# Patient Record
Sex: Female | Born: 1980 | Race: White | Hispanic: No | Marital: Married | Smoking: Never smoker
Health system: Southern US, Community
[De-identification: ages and names within clinical notes are randomized; demographics above are authoritative.]

## PROBLEM LIST (undated history)

## (undated) DIAGNOSIS — R87629 Unspecified abnormal cytological findings in specimens from vagina: Secondary | ICD-10-CM

## (undated) DIAGNOSIS — Z98891 History of uterine scar from previous surgery: Secondary | ICD-10-CM

## (undated) HISTORY — DX: History of uterine scar from previous surgery: Z98.891

## (undated) HISTORY — DX: Unspecified abnormal cytological findings in specimens from vagina: R87.629

---
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
## 2000-04-01 ENCOUNTER — Other Ambulatory Visit: Admission: RE | Admit: 2000-04-01 | Discharge: 2000-04-01 | Payer: Self-pay | Admitting: Obstetrics and Gynecology

## 2002-09-15 ENCOUNTER — Emergency Department (HOSPITAL_COMMUNITY): Admission: EM | Admit: 2002-09-15 | Discharge: 2002-09-16 | Payer: Self-pay | Admitting: Emergency Medicine

## 2002-09-15 ENCOUNTER — Encounter: Payer: Self-pay | Admitting: Emergency Medicine

## 2003-07-13 ENCOUNTER — Ambulatory Visit (HOSPITAL_COMMUNITY): Admission: RE | Admit: 2003-07-13 | Discharge: 2003-07-13 | Payer: Self-pay | Admitting: *Deleted

## 2010-04-15 ENCOUNTER — Encounter: Payer: Self-pay | Admitting: *Deleted

## 2010-09-24 ENCOUNTER — Emergency Department: Payer: Self-pay | Admitting: Emergency Medicine

## 2011-03-26 DIAGNOSIS — R87629 Unspecified abnormal cytological findings in specimens from vagina: Secondary | ICD-10-CM

## 2011-03-26 HISTORY — DX: Unspecified abnormal cytological findings in specimens from vagina: R87.629

## 2014-01-21 ENCOUNTER — Other Ambulatory Visit: Payer: Self-pay | Admitting: Gynecologic Oncology

## 2014-03-25 NOTE — L&D Delivery Note (Signed)
Delivery Summary for Taylor [name]  Labor Events:   Preterm labor:   Rupture date:   Rupture time:   Rupture type:   Fluid Color:   Induction:   Augmentation:   Complications:   Cervical ripening:          Delivery:   Episiotomy:   Lacerations:   Repair suture:   Repair # of packets:   Blood loss (ml): 700   Information for the patient's newborn:  Taylor [name], Taylor [name] [[id number]][[id number]]    Delivery 02/02/2015 12:45 PM by  C-Section, Low Transverse Sex:  female Gestational Age: 2884w2d Delivery Clinician:  Hildred LaserAnika Taron Mondor Living?: Yes        APGARS  One minute Five minutes Ten minutes  Skin color: 0   1      Heart rate: 2   2      Grimace: 2   2      Muscle tone: 1   2      Breathing: 1   2      Totals: 6  9      Presentation/position:      Resuscitation:   Cord information:    Disposition of cord blood:     Blood gases sent?  Complications:   Placenta: Delivered:       appearance Newborn Measurements: Weight: 8 lb 3.6 oz (3730 g)  Height:    Head circumference:    Chest circumference:    Other providers: Registered Nurse Registered Nurse Transition RN Neonatologist Verlin Dikeina M Braimah Kelly A Yates Carolyn E Wanek David C Ehrmann  Additional  information: Forceps:   Vacuum:   Breech:   Observed anomalies         See Cesarean Section Operative Note for details of delivery procedure.    Hildred LaserAnika Rayshon Albaugh, MD [facility]

## 2014-05-02 LAB — HM PAP SMEAR

## 2014-07-22 LAB — OB RESULTS CONSOLE ABO/RH: RH Type: POSITIVE

## 2014-07-22 LAB — OB RESULTS CONSOLE HEPATITIS B SURFACE ANTIGEN: Hepatitis B Surface Ag: NEGATIVE

## 2014-07-22 LAB — OB RESULTS CONSOLE RUBELLA ANTIBODY, IGM: Rubella: NON-IMMUNE/NOT IMMUNE

## 2014-07-22 LAB — OB RESULTS CONSOLE PLATELET COUNT: Platelets: 198 10*3/uL

## 2014-07-22 LAB — OB RESULTS CONSOLE HIV ANTIBODY (ROUTINE TESTING): HIV: NONREACTIVE

## 2014-07-22 LAB — OB RESULTS CONSOLE GC/CHLAMYDIA
Chlamydia: NEGATIVE
Gonorrhea: NEGATIVE

## 2014-07-22 LAB — OB RESULTS CONSOLE HGB/HCT, BLOOD
HCT: 35 %
Hemoglobin: 12.1 g/dL

## 2014-07-22 LAB — OB RESULTS CONSOLE VARICELLA ZOSTER ANTIBODY, IGG: VARICELLA IGG: NON-IMMUNE/NOT IMMUNE

## 2014-07-22 LAB — OB RESULTS CONSOLE RPR: RPR: NONREACTIVE

## 2014-08-15 ENCOUNTER — Ambulatory Visit
Admission: RE | Admit: 2014-08-15 | Discharge: 2014-08-15 | Disposition: A | Discharge status: Home / Self Care | Payer: Medicaid Other | Source: Ambulatory Visit | Attending: Maternal & Fetal Medicine | Admitting: Maternal & Fetal Medicine

## 2014-08-15 VITALS — BP 116/66 | HR 95 | Temp 98.7°F | Ht 60.0 in | Wt 113.0 lb

## 2014-08-15 DIAGNOSIS — Z8279 Family history of other congenital malformations, deformations and chromosomal abnormalities: Secondary | ICD-10-CM

## 2014-08-15 NOTE — Progress Notes (Signed)
Defrancesco, Taylor Taylor [name] Length of Consultation: 50 minutes  Taylor Taylor [name] [facility] for genetic counseling to review prenatal screening and testing options due to Taylor Taylor [name] of anomalies in her first pregnancy.  This note summarizes the information we discussed.    We obtained Taylor [name] detailed family Taylor [name] and pregnancy Taylor [name].  This is the second pregnancy for this patient, the first with her current partner.  Taylor Taylor [name] reported no complications or exposures which are expected to increase the risk for birth defects in the current pregnancy.  The family Taylor [name] is remarkable for their daughter, Taylor Taylor [name], being born with multiple health concerns.  She is now 34 years old with normal cognitive development.  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  There are no other family members with birth defects, developmental differences, recurrent pregnancy loss or known genetic conditions.  Taylor Taylor [name] stated that all testing during her pregnancy with Taylor Taylor [name] and after delivery was normal, and that no specific cause for Taylor Taylor [name]'s condition was identified.  We discussed that her chromosomal microarray results from [facility] were normal, which assesses the chance for small deletions or duplications of genetic material, but that this testing cannot diagnose or rule out all genetic syndromes.  The diagnosis of Klippel-Feil is known to be associated with fused vertebrae, cleft palate, hearing loss, urinary tract anomalies, heart defects and other birth differences.  Therefore, this condition can account for some of Taylor Taylor [name]'s health concerns.  Hydrocephaly is not commonly  associated with this condition.  Most cases of Klippel-Feil occur sporadically, with no other affected family members.  Some cases, however, are known to be due to differences in specific genes (GDF6, GDF3 and MEOX1) that can be inherited as dominant or recessive traits.  If Taylor Taylor [name] desires additional genetic evaluation or DNA testing for Taylor Taylor [name] for any of these genetic mutations, another visit to [facility] could be scheduled.  Their number is [phone].  Given the absence of any other family Taylor [name] of Klippel-Feil or similar health concerns and the fact that this pregnancy is with Taylor [name] different partner, we would expect Taylor [name] low recurrence chance for this pregnancy.  We discussed that we may use level 2 ultrasound after 16 to [redacted] weeks gestation to assess for similar health concerns in this pregnancy.  However, it is important to remember that not all birth defects can be diagnosed prenatally.    As part of routine prenatal care, Taylor Taylor [name] stated that she had normal first trimester screening at [facility].  If there are any concerns with those results, we are happy to dicuss this further.    Cystic Fibrosis screening was also discussed with the patient. Cystic fibrosis (CF) is one of the most common genetic conditions in persons of Caucasian ancestry.  This condition occurs in approximately 1 in 2,500 Caucasian persons and results in thickened secretions in the lungs, digestive, and reproductive systems.  For Taylor [name] baby to be at risk for having CF, both of the parents must be carriers for this condition.  Approximately 1 in 5725 Caucasian persons is Taylor [name] carrier for CF.  Current carrier testing looks for the most common mutations in the gene  for CF and can detect approximately 90% of carriers in the Caucasian population.  This means that the carrier screening can greatly reduce, but cannot eliminate, the chance for an individual to have Taylor [name] child with CF.  If an individual is found to be Taylor [name]  carrier for CF, then carrier testing would be available for the partner. As part of [place]'s newborn screening profile, all babies born in the state of [place] will have Taylor [name] two-tier screening process.  Specimens are first tested to determine the concentration of immunoreactive trypsinogen (IRT).  The top 5% of specimens with the highest IRT values then undergo DNA testing using Taylor [name] panel of over 40 common CF mutations.   After consideration of the options, Taylor Taylor [name] elected to proceed with scheduling an ultrasound in the second trimester for Taylor [name] detailed evaluation of the fetal anatomy and to decline CF carrier screening.  Ms. Warren Lacy was encouraged to call with questions or concerns.  We can be contacted at [phone].   Taylor Anderson, MS, CGC

## 2014-08-25 NOTE — Progress Notes (Signed)
Pt reviewed with Mike GipGC Wells, agree with plans as outlined in consultation note.

## 2014-09-01 ENCOUNTER — Encounter: Payer: Self-pay | Admitting: Obstetrics and Gynecology

## 2014-09-01 ENCOUNTER — Ambulatory Visit (INDEPENDENT_AMBULATORY_CARE_PROVIDER_SITE_OTHER): Payer: Medicaid Other | Admitting: Obstetrics and Gynecology

## 2014-09-01 ENCOUNTER — Ambulatory Visit
Admission: RE | Admit: 2014-09-01 | Discharge: 2014-09-01 | Disposition: A | Discharge status: Home / Self Care | Payer: Medicaid Other | Source: Ambulatory Visit | Attending: Maternal & Fetal Medicine | Admitting: Maternal & Fetal Medicine

## 2014-09-01 VITALS — BP 128/72 | HR 99 | Temp 98.1°F | Resp 18 | Ht 60.0 in | Wt 115.0 lb

## 2014-09-01 VITALS — BP 125/66 | HR 90 | Wt 115.1 lb

## 2014-09-01 DIAGNOSIS — O352XX0 Maternal care for (suspected) hereditary disease in fetus, not applicable or unspecified: Secondary | ICD-10-CM | POA: Insufficient documentation

## 2014-09-01 DIAGNOSIS — Z3402 Encounter for supervision of normal first pregnancy, second trimester: Secondary | ICD-10-CM

## 2014-09-01 DIAGNOSIS — Z3A17 17 weeks gestation of pregnancy: Secondary | ICD-10-CM | POA: Insufficient documentation

## 2014-09-01 DIAGNOSIS — Z36 Encounter for antenatal screening of mother: Secondary | ICD-10-CM | POA: Insufficient documentation

## 2014-09-01 LAB — POCT URINALYSIS DIPSTICK
Bilirubin, UA: NEGATIVE
Blood, UA: NEGATIVE
Glucose, UA: NEGATIVE
Ketones, UA: NEGATIVE
NITRITE UA: NEGATIVE
Protein, UA: NEGATIVE
SPEC GRAV UA: 1.02
Urobilinogen, UA: 0.2
pH, UA: 6.5

## 2014-09-01 NOTE — Patient Instructions (Addendum)
Follow up in 4 weeks.  Given hot weather precautions.

## 2014-09-01 NOTE — Progress Notes (Signed)
Patient doing well. No complaints.  Notes that she will be traveling to [facility] this weekend.  Advised on hot weather precautions. Is s/p consultation with [facility] for current pregnancy due to multiple fetal anomalies in prior pregnancy (although no genetic cause found). Reports having detailed anatomy scan earlier this week at [facility] with normal findings.  1st trimester screening with NT was negative. Reviewed note from [facility]. Awaiting ultrasuond report. Can continue with routine PNC follow up.  RTC in 4 weeks.

## 2014-09-29 ENCOUNTER — Other Ambulatory Visit: Payer: Self-pay | Admitting: Obstetrics and Gynecology

## 2014-09-29 ENCOUNTER — Ambulatory Visit (INDEPENDENT_AMBULATORY_CARE_PROVIDER_SITE_OTHER): Payer: Medicaid Other | Admitting: Obstetrics and Gynecology

## 2014-09-29 ENCOUNTER — Encounter: Payer: Self-pay | Admitting: Obstetrics and Gynecology

## 2014-09-29 VITALS — BP 100/63 | HR 92 | Wt 122.5 lb

## 2014-09-29 DIAGNOSIS — Z3492 Encounter for supervision of normal pregnancy, unspecified, second trimester: Secondary | ICD-10-CM

## 2014-09-29 DIAGNOSIS — Z1379 Encounter for other screening for genetic and chromosomal anomalies: Secondary | ICD-10-CM

## 2014-09-29 DIAGNOSIS — O352XX1 Maternal care for (suspected) hereditary disease in fetus, fetus 1: Secondary | ICD-10-CM

## 2014-09-29 LAB — POCT URINALYSIS DIPSTICK
Bilirubin, UA: NEGATIVE
Blood, UA: NEGATIVE
Glucose, UA: NEGATIVE
Ketones, UA: NEGATIVE
LEUKOCYTES UA: NEGATIVE
Nitrite, UA: NEGATIVE
Protein, UA: NEGATIVE
Spec Grav, UA: <=1.005
Urobilinogen, UA: 0.2
pH, UA: 7

## 2014-09-29 NOTE — Progress Notes (Signed)
No complaints. Saw [facility] 4 weeks ago. Will see them again 11/17/2014.

## 2014-09-29 NOTE — Progress Notes (Signed)
ROB: Doing well without complaints.  For serum AFP today.  Will f/u with [facility] (still not received).  Is scheduled to return to them for f/u at 28 weeks. RTC in 4 weeks.

## 2014-10-01 LAB — AFP, SERUM, OPEN SPINA BIFIDA
AFP MoM: 0.71
AFP Value: 53.9 ng/mL
Gest. Age on Collection Date: 21.3 weeks
MATERNAL AGE AT EDD: 34.2 a
OSBR RISK 1 IN: 10000
Test Results:: NEGATIVE
WEIGHT: 122 [lb_av]

## 2014-10-27 ENCOUNTER — Ambulatory Visit (INDEPENDENT_AMBULATORY_CARE_PROVIDER_SITE_OTHER): Payer: Medicaid Other | Admitting: Obstetrics and Gynecology

## 2014-10-27 VITALS — BP 120/74 | HR 88 | Wt 128.0 lb

## 2014-10-27 DIAGNOSIS — Z1389 Encounter for screening for other disorder: Secondary | ICD-10-CM

## 2014-10-27 DIAGNOSIS — Z3492 Encounter for supervision of normal pregnancy, unspecified, second trimester: Secondary | ICD-10-CM

## 2014-10-27 LAB — POCT URINALYSIS DIPSTICK
BILIRUBIN UA: NEGATIVE
GLUCOSE UA: NEGATIVE
KETONES UA: NEGATIVE
Leukocytes, UA: NEGATIVE
Nitrite, UA: NEGATIVE
Protein, UA: NEGATIVE
RBC UA: NEGATIVE
SPEC GRAV UA: 1.015
Urobilinogen, UA: NEGATIVE
pH, UA: 7

## 2014-10-27 NOTE — Progress Notes (Signed)
ROB: Doing well.  Denies complaints.  Serum AFP neg. RTC in 3 weeks. For 28 week labs, Tdap, and sign blood consents at that time.

## 2014-11-17 ENCOUNTER — Ambulatory Visit
Admission: RE | Admit: 2014-11-17 | Discharge: 2014-11-17 | Disposition: A | Discharge status: Home / Self Care | Payer: Medicaid Other | Source: Ambulatory Visit | Attending: Obstetrics and Gynecology | Admitting: Obstetrics and Gynecology

## 2014-11-17 ENCOUNTER — Other Ambulatory Visit: Payer: Self-pay

## 2014-11-17 ENCOUNTER — Ambulatory Visit (INDEPENDENT_AMBULATORY_CARE_PROVIDER_SITE_OTHER): Payer: Medicaid Other | Admitting: Obstetrics and Gynecology

## 2014-11-17 VITALS — BP 99/67 | HR 101 | Wt 130.6 lb

## 2014-11-17 DIAGNOSIS — O352XX0 Maternal care for (suspected) hereditary disease in fetus, not applicable or unspecified: Secondary | ICD-10-CM | POA: Diagnosis not present

## 2014-11-17 DIAGNOSIS — R7309 Other abnormal glucose: Secondary | ICD-10-CM

## 2014-11-17 DIAGNOSIS — O09299 Supervision of pregnancy with other poor reproductive or obstetric history, unspecified trimester: Secondary | ICD-10-CM

## 2014-11-17 DIAGNOSIS — Z3A28 28 weeks gestation of pregnancy: Secondary | ICD-10-CM | POA: Diagnosis not present

## 2014-11-17 DIAGNOSIS — Z3492 Encounter for supervision of normal pregnancy, unspecified, second trimester: Secondary | ICD-10-CM

## 2014-11-17 DIAGNOSIS — Z23 Encounter for immunization: Secondary | ICD-10-CM

## 2014-11-17 DIAGNOSIS — O352XX1 Maternal care for (suspected) hereditary disease in fetus, fetus 1: Secondary | ICD-10-CM

## 2014-11-17 NOTE — Progress Notes (Signed)
ROB: Doing well. Denies complaints. Had anatomy scan with [facility], normal except mildly dilated unilateral renal pelvis.  Recommend f/u in 5 weeks, scheduled.  Glucola performed, Tdap done, blood consent signed today.  RTC in 3 weeks.

## 2014-11-18 ENCOUNTER — Telehealth: Payer: Self-pay

## 2014-11-18 DIAGNOSIS — R7309 Other abnormal glucose: Secondary | ICD-10-CM

## 2014-11-18 LAB — GLUCOSE TOLERANCE, 1 HOUR: Glucose, 1Hr PP: 152 mg/dL (ref 65–199)

## 2014-11-18 LAB — HEMOGLOBIN AND HEMATOCRIT, BLOOD
HEMOGLOBIN: 9.7 g/dL — AB (ref 11.1–15.9)
Hematocrit: 28.4 % — ABNORMAL LOW (ref 34.0–46.6)

## 2014-11-18 NOTE — Telephone Encounter (Signed)
-----   Message from Hildred Laser, MD sent at 11/18/2014 12:40 PM EDT ----- Please inform patient that her glucola was elevated (152), needs to perform 3 hr testing.  Also, has mild anemia of pregnancy, needs to begin taking a daily iron supplement in addition to her PNV.

## 2014-11-18 NOTE — Telephone Encounter (Signed)
Pt notified of elevated glucola and need for 3 hr testing and of need to take iron in addition to PNV.

## 2014-11-18 NOTE — Telephone Encounter (Deleted)
-----   Message from Taylor Cherry, MD sent at 11/18/2014 12:40 PM EDT ----- Please inform patient that her glucola was elevated (152), needs to perform 3 hr testing.  Also, has mild anemia of pregnancy, needs to begin taking a daily iron supplement in addition to her PNV. 

## 2014-11-23 ENCOUNTER — Other Ambulatory Visit: Payer: Medicaid Other

## 2014-11-23 DIAGNOSIS — O99013 Anemia complicating pregnancy, third trimester: Secondary | ICD-10-CM | POA: Insufficient documentation

## 2014-11-24 LAB — GESTATIONAL GLUCOSE TOLERANCE
GLUCOSE 1 HOUR GTT: 162 mg/dL (ref 65–179)
GLUCOSE 2 HOUR GTT: 153 mg/dL (ref 65–154)
Glucose, Fasting: 78 mg/dL (ref 65–94)
Glucose, GTT - 3 Hour: 150 mg/dL — ABNORMAL HIGH (ref 65–139)

## 2014-11-29 ENCOUNTER — Ambulatory Visit (INDEPENDENT_AMBULATORY_CARE_PROVIDER_SITE_OTHER): Payer: Medicaid Other | Admitting: Obstetrics and Gynecology

## 2014-11-29 ENCOUNTER — Encounter: Payer: Self-pay | Admitting: Obstetrics and Gynecology

## 2014-11-29 VITALS — BP 98/59 | HR 97 | Wt 131.8 lb

## 2014-11-29 DIAGNOSIS — Z23 Encounter for immunization: Secondary | ICD-10-CM | POA: Diagnosis not present

## 2014-11-29 DIAGNOSIS — Z3403 Encounter for supervision of normal first pregnancy, third trimester: Secondary | ICD-10-CM

## 2014-11-29 LAB — POCT URINALYSIS DIPSTICK
Bilirubin, UA: NEGATIVE
Glucose, UA: NEGATIVE
Ketones, UA: NEGATIVE
NITRITE UA: NEGATIVE
PH UA: 7
Protein, UA: NEGATIVE
RBC UA: NEGATIVE
Spec Grav, UA: <=1.005
UROBILINOGEN UA: NEGATIVE

## 2014-11-29 NOTE — Progress Notes (Signed)
ROB: Patient doing well, no complaints.  For flu vaccine today.  Desires OCPs for contraception.  Discussed TOLAC vs repeat C-section.  Patient desires C-section. To be scheduled for repeat C-section on 02/02/2015.  1 hr glucola abnormal, 3 hr normal.  RTC in 2 weeks.

## 2014-12-13 ENCOUNTER — Ambulatory Visit (INDEPENDENT_AMBULATORY_CARE_PROVIDER_SITE_OTHER): Payer: Medicaid Other | Admitting: Obstetrics and Gynecology

## 2014-12-13 VITALS — BP 107/72 | HR 98 | Wt 132.4 lb

## 2014-12-13 DIAGNOSIS — Z3493 Encounter for supervision of normal pregnancy, unspecified, third trimester: Secondary | ICD-10-CM

## 2014-12-13 DIAGNOSIS — O352XX1 Maternal care for (suspected) hereditary disease in fetus, fetus 1: Secondary | ICD-10-CM

## 2014-12-13 LAB — POCT URINALYSIS DIPSTICK
Bilirubin, UA: NEGATIVE
Glucose, UA: NEGATIVE
KETONES UA: NEGATIVE
Nitrite, UA: NEGATIVE
PH UA: 6.5
PROTEIN UA: NEGATIVE
RBC UA: NEGATIVE
SPEC GRAV UA: 1.015
Urobilinogen, UA: NEGATIVE

## 2014-12-13 NOTE — Progress Notes (Signed)
ROB: Denies complaints except occasional Deberah Pelton. PTL precautions reviewed.  RTC in 2 weeks.

## 2014-12-13 NOTE — Progress Notes (Signed)
Braxton hicks

## 2014-12-22 ENCOUNTER — Ambulatory Visit
Admission: RE | Admit: 2014-12-22 | Discharge: 2014-12-22 | Disposition: A | Discharge status: Home / Self Care | Payer: Medicaid Other | Source: Ambulatory Visit | Attending: Obstetrics and Gynecology | Admitting: Obstetrics and Gynecology

## 2014-12-27 ENCOUNTER — Ambulatory Visit (INDEPENDENT_AMBULATORY_CARE_PROVIDER_SITE_OTHER): Payer: Medicaid Other | Admitting: Obstetrics and Gynecology

## 2014-12-27 VITALS — BP 108/68 | HR 102 | Temp 98.9°F | Wt 135.5 lb

## 2014-12-27 DIAGNOSIS — O289 Unspecified abnormal findings on antenatal screening of mother: Secondary | ICD-10-CM | POA: Insufficient documentation

## 2014-12-27 DIAGNOSIS — O409XX0 Polyhydramnios, unspecified trimester, not applicable or unspecified: Secondary | ICD-10-CM

## 2014-12-27 DIAGNOSIS — Z3493 Encounter for supervision of normal pregnancy, unspecified, third trimester: Secondary | ICD-10-CM

## 2014-12-27 LAB — POCT URINALYSIS DIP (MANUAL ENTRY)
Bilirubin, UA: NEGATIVE
Blood, UA: NEGATIVE
Glucose, UA: NEGATIVE
Ketones, POC UA: NEGATIVE
LEUKOCYTES UA: NEGATIVE
NITRITE UA: NEGATIVE
PH UA: 7.5
PROTEIN UA: NEGATIVE
Spec Grav, UA: <=1.005
Urobilinogen, UA: 0.2

## 2014-12-27 NOTE — Progress Notes (Signed)
ROB: Patient doing well, however does note becoming more fatigued.  S/p ultrasound with [facility], notes that she was told her fluid level was increased (upper limitis of normal).  Notes fetus weights ~ 5 1/2 lbs. RTC in 2 weeks. For 36 week labs. Will recheck AFI at that time.

## 2015-01-10 ENCOUNTER — Ambulatory Visit (INDEPENDENT_AMBULATORY_CARE_PROVIDER_SITE_OTHER): Payer: Medicaid Other | Admitting: Obstetrics and Gynecology

## 2015-01-10 ENCOUNTER — Ambulatory Visit: Payer: Medicaid Other

## 2015-01-10 VITALS — BP 131/75 | HR 120 | Wt 138.3 lb

## 2015-01-10 DIAGNOSIS — O352XX0 Maternal care for (suspected) hereditary disease in fetus, not applicable or unspecified: Secondary | ICD-10-CM

## 2015-01-10 DIAGNOSIS — O409XX0 Polyhydramnios, unspecified trimester, not applicable or unspecified: Secondary | ICD-10-CM

## 2015-01-10 DIAGNOSIS — O289 Unspecified abnormal findings on antenatal screening of mother: Secondary | ICD-10-CM

## 2015-01-10 DIAGNOSIS — Z3493 Encounter for supervision of normal pregnancy, unspecified, third trimester: Secondary | ICD-10-CM

## 2015-01-10 DIAGNOSIS — O352XX1 Maternal care for (suspected) hereditary disease in fetus, fetus 1: Secondary | ICD-10-CM

## 2015-01-10 LAB — POCT URINALYSIS DIP (MANUAL ENTRY)
BILIRUBIN UA: NEGATIVE
BILIRUBIN UA: NEGATIVE
Glucose, UA: NEGATIVE
Leukocytes, UA: NEGATIVE
NITRITE UA: NEGATIVE
PH UA: 6
Protein Ur, POC: NEGATIVE
RBC UA: NEGATIVE
Spec Grav, UA: <=1.005
Urobilinogen, UA: 0.2

## 2015-01-10 NOTE — Progress Notes (Signed)
ROB: Patient doing well.  Denies complaints.  Notes infrequent contractions.  PTL precautions given.  S/p f/u AFI for recent diagnosis of polyhydramnios.  AFI 23 cm today.  For weekly NSTs until delivery. Reiterated fetal kick counts.  Scheduled for repeat C-section in 3 weeks.   FETAL SURVEILLANCE TESTING SUMMARY  INDICATIONS: Polyhydramnios  OBJECTIVE RESULTS: Fetal heart variability: moderate Fetal Heart Rate decelerations: none Fetal Heart Rate accelerations: yes Baseline FHR: 145 per minute Fetal Non-stress Test: reactive Amniotic Fluid Index: 23 cm Uterine irritability: yes  ASSESSMENT: IUP at 36.0 weeks  Fetal surveillance: reassuring   PLAN:  1. Fetal kick counts twice daily 2. NST weekly 3. Scheduled for repeat C-section at 39 weeks.

## 2015-01-12 LAB — GC/CHLAMYDIA PROBE AMP
CHLAMYDIA, DNA PROBE: NEGATIVE
NEISSERIA GONORRHOEAE BY PCR: NEGATIVE

## 2015-01-15 LAB — CULTURE, BETA STREP (GROUP B ONLY): STREP GP B CULTURE: NEGATIVE

## 2015-01-17 ENCOUNTER — Ambulatory Visit (INDEPENDENT_AMBULATORY_CARE_PROVIDER_SITE_OTHER): Payer: Medicaid Other | Admitting: Obstetrics and Gynecology

## 2015-01-17 VITALS — BP 111/69 | HR 99 | Wt 137.2 lb

## 2015-01-17 DIAGNOSIS — O289 Unspecified abnormal findings on antenatal screening of mother: Secondary | ICD-10-CM

## 2015-01-17 DIAGNOSIS — O409XX0 Polyhydramnios, unspecified trimester, not applicable or unspecified: Secondary | ICD-10-CM

## 2015-01-17 DIAGNOSIS — Z3493 Encounter for supervision of normal pregnancy, unspecified, third trimester: Secondary | ICD-10-CM | POA: Diagnosis not present

## 2015-01-17 DIAGNOSIS — Z8279 Family history of other congenital malformations, deformations and chromosomal abnormalities: Secondary | ICD-10-CM

## 2015-01-17 NOTE — Progress Notes (Signed)
ROB: Patient doing well.  Denies complaints.  Labor precautions given.  For weekly NSTs until delivery for polyhydramnios. Reiterated fetal kick counts.  For pre-op next week for C-section at 39 weeks.  RTC in 1 week.  FETAL SURVEILLANCE TESTING SUMMARY  INDICATIONS: Polyhydramnios  OBJECTIVE RESULTS: Fetal heart variability: moderate Fetal Heart Rate decelerations: none Fetal Heart Rate accelerations: yes Baseline FHR: 140 per minute Fetal Non-stress Test: reactive Uterine contractions: yes, irregular  ASSESSMENT: IUP at 36.0 weeks  Fetal surveillance: reassuring   PLAN:  1. Fetal kick counts twice daily 2. NST weekly 3. Scheduled for repeat C-section at 39 weeks.

## 2015-01-26 ENCOUNTER — Ambulatory Visit (INDEPENDENT_AMBULATORY_CARE_PROVIDER_SITE_OTHER): Payer: Medicaid Other | Admitting: Obstetrics and Gynecology

## 2015-01-26 VITALS — BP 115/72 | HR 96 | Wt 137.9 lb

## 2015-01-26 DIAGNOSIS — O409XX0 Polyhydramnios, unspecified trimester, not applicable or unspecified: Secondary | ICD-10-CM

## 2015-01-26 DIAGNOSIS — Z3493 Encounter for supervision of normal pregnancy, unspecified, third trimester: Secondary | ICD-10-CM

## 2015-01-26 DIAGNOSIS — O289 Unspecified abnormal findings on antenatal screening of mother: Secondary | ICD-10-CM

## 2015-01-26 DIAGNOSIS — O99013 Anemia complicating pregnancy, third trimester: Secondary | ICD-10-CM

## 2015-01-26 LAB — POCT URINALYSIS DIPSTICK
BILIRUBIN UA: NEGATIVE
GLUCOSE UA: NEGATIVE
Ketones, UA: NEGATIVE
Leukocytes, UA: NEGATIVE
Nitrite, UA: NEGATIVE
Protein, UA: NEGATIVE
RBC UA: NEGATIVE
SPEC GRAV UA: 1.01
UROBILINOGEN UA: NEGATIVE
pH, UA: 7

## 2015-01-26 NOTE — Progress Notes (Signed)
NONSTRESS TEST INTERPRETATION  INDICATIONS: Polyhydramnios, contractions FHR baseline: 145 RESULTS: reactive COMMENTS: contractions every 6-8 min.   PLAN: 1. Continue fetal kick counts twice a day. 2. Scheduled for C/S on 02/02/2015  Fenton Mallingebbie Sharaine Delange, LPN

## 2015-01-26 NOTE — Progress Notes (Signed)
ROB: Patient reports notes occasional ctx, mildly painful.  Pre-op performed today for scheduled C-section on 02/02/15. NST today performed, reactive, with contractions q 6-8 min. Cervix unchanged.  Labor precautions given.

## 2015-01-30 NOTE — H&P (Signed)
Obstetric Preoperative History and Physical  Taylor [name] is a 34 y.o. G2P1001 with IUP at 4659w2d presenting for pre-operative exam for scheduled repeat cesarean section on 02/02/2015.  No acute concerns.   Prenatal Course Source of Care: [facility] with onset of care at 11 weeks Pregnancy complications or risks: Patient Active Problem List   Diagnosis Date Noted  . AFI (amniotic fluid index) increased 12/27/2014  . Anemia of pregnancy in third trimester 11/23/2014  . Previous child with congenital anomaly, currently pregnant, antepartum 09/01/2014  . Family history of congenital anomalies 08/15/2014   She plans to breastfeed She desires oral contraceptives (estrogen/progesterone) for postpartum contraception.   Prenatal labs and studies: ABO, Rh: O/Positive/-- (04/29 0000) Antibody:  Negative (04/29 0000) Rubella: Nonimmune (04/29 0000) RPR: Nonreactive (04/29 0000)  HBsAg: Negative (04/29 0000)  HIV: Non-reactive (04/29 0000)  GBS: Negative (10/18  0258) 1 hr Glucola elevated (152), 3 hr GTT with 1 abnormal value Genetic screening normal Anatomy US normal  Prenatal Transfer Tool  Maternal Diabetes: No Genetic Screening: Normal Maternal Ultrasounds/Referrals: Normal except for -  Abnormal:  Findings:   Other: Increased amniotic fluid Fetal Ultrasounds or other Referrals:  Referred to Materal Fetal Medicine  for history of previous child with congenital anomaly Maternal Substance Abuse:  No Significant Maternal Medications:  None Significant Maternal Lab Results: None  Past Medical History  Diagnosis Date  . Vaginal Pap smear, abnormal 2013    colpo neg last3 normal  . Need for Tdap vaccination     at 28 weeks  . History of cesarean section     Past Surgical History  Procedure Laterality Date  . Cesarean section  2005    OB History  Gravida Para Term Preterm AB SAB TAB Ectopic Multiple Living  2 1 1       1     # Outcome Date GA Lbr Len/2nd  Weight Sex Delivery Anes PTL Lv  2 Current           1 Term 2005   6 lb 8 oz (2.948 kg) F CS-Unspec   Y      Social History   Social History  . Marital Status: Single    Spouse Name: N/A  . Number of Children: N/A  . Years of Education: N/A   Social History Main Topics  . Smoking status: Never Smoker   . Smokeless tobacco: Never Used  . Alcohol Use: No  . Drug Use: No  . Sexual Activity: Yes    Birth Control/ Protection: None     Comment: Preganant   Other Topics Concern  . Not on file   Social History Narrative    Family History  Problem Relation Age of Onset  . Diabetes Neg Hx   . Heart disease Neg Hx   . Breast cancer Neg Hx   . Ovarian cancer Neg Hx   . Hyperlipidemia Mother      (Not in a hospital admission)  No Known Allergies  Review of Systems: Negative except for what is mentioned in HPI.  Physical Exam: BP 115/72 mmHg  Pulse 96  Wt 137 lb 14.4 oz (62.551 kg)  LMP 05/03/2014 FHR by Doppler: 153 bpm GENERAL: Well-developed, well-nourished female in no acute distress.  LUNGS: Clear to auscultation bilaterally.  HEART: Regular rate and rhythm. ABDOMEN: Soft, nontender, nondistended, gravid, well-healed Pfannenstiel incision. PELVIC: Deferred EXTREMITIES: Nontender, no edema, 2+ distal pulses.   Pertinent Labs/Studies:   Lab Results  Component Value Date  HGB 9.7* 11/17/2014   HCT 28.4* 11/17/2014   PLT 198 07/22/2014   Assessment and Plan :Taylor OBRIEN is a 34 y.o. G2P1001 at [redacted]w[redacted]d being scheduled for repeat scheduled cesarean section delivery on 02/02/2015 . The patient is understanding of the planned procedure and is aware of and accepting of all surgical risks, including but not limited to: bleeding which may require transfusion or reoperation; infection which may require antibiotics; injury to bowel, bladder, ureters or other surrounding organs which may require repair; injury to the fetus; need for additional procedures including  hysterectomy in the event of life-threatening complications; placental abnormalities wth subsequent pregnancies; incisional problems; blood clot disorders which may require blood thinners;, and other postoperative/anesthesia complications. The patient is in agreement with the proposed plan, and gives informed written consent for the procedure. All questions have been answered.    Hildred Laser, MD [facility]

## 2015-02-01 ENCOUNTER — Encounter
Admission: RE | Admit: 2015-02-01 | Discharge: 2015-02-01 | Disposition: A | Discharge status: Home / Self Care | Payer: Medicaid Other | Source: Ambulatory Visit | Attending: Obstetrics and Gynecology | Admitting: Obstetrics and Gynecology

## 2015-02-01 LAB — CBC
HEMATOCRIT: 29.7 % — AB (ref 35.0–47.0)
Hemoglobin: 9.9 g/dL — ABNORMAL LOW (ref 12.0–16.0)
MCH: 29.6 pg (ref 26.0–34.0)
MCHC: 33.3 g/dL (ref 32.0–36.0)
MCV: 88.9 fL (ref 80.0–100.0)
PLATELETS: 134 10*3/uL — AB (ref 150–440)
RBC: 3.35 MIL/uL — ABNORMAL LOW (ref 3.80–5.20)
RDW: 14.4 % (ref 11.5–14.5)
WBC: 8.9 10*3/uL (ref 3.6–11.0)

## 2015-02-01 LAB — RAPID HIV SCREEN (HIV 1/2 AB+AG)
HIV 1/2 Antibodies: NONREACTIVE
HIV-1 P24 Antigen - HIV24: NONREACTIVE

## 2015-02-01 LAB — TYPE AND SCREEN
ABO/RH(D): O POS
ANTIBODY SCREEN: NEGATIVE
EXTEND SAMPLE REASON: UNDETERMINED

## 2015-02-01 LAB — ABO/RH: ABO/RH(D): O POS

## 2015-02-01 NOTE — Pre-Procedure Instructions (Deleted)
Called Dr. Oretha Milchherry's office for preop orders at 0817am

## 2015-02-01 NOTE — Pre-Procedure Instructions (Signed)
Pt instructed not to eat or drink after midnight tonight in preparation for surgery tomorrow.

## 2015-02-02 ENCOUNTER — Inpatient Hospital Stay: Payer: Medicaid Other | Admitting: Anesthesiology

## 2015-02-02 ENCOUNTER — Inpatient Hospital Stay
Admission: RE | Admit: 2015-02-02 | Discharge: 2015-02-04 | DRG: 766 | Disposition: A | Discharge status: Home / Self Care | Payer: Medicaid Other | Source: Ambulatory Visit | Attending: Obstetrics and Gynecology | Admitting: Obstetrics and Gynecology

## 2015-02-02 ENCOUNTER — Encounter: Payer: Self-pay | Admitting: Anesthesiology

## 2015-02-02 ENCOUNTER — Encounter
Admission: RE | Disposition: A | Discharge status: Home / Self Care | Payer: Self-pay | Source: Ambulatory Visit | Attending: Obstetrics and Gynecology

## 2015-02-02 ENCOUNTER — Emergency Department
Admission: EM | Admit: 2015-02-02 | Discharge: 2015-02-02 | Disposition: A | Discharge status: Other Acute Inpatient Hospital | Payer: Medicaid Other

## 2015-02-02 DIAGNOSIS — O409XX0 Polyhydramnios, unspecified trimester, not applicable or unspecified: Secondary | ICD-10-CM | POA: Diagnosis present

## 2015-02-02 DIAGNOSIS — O9081 Anemia of the puerperium: Secondary | ICD-10-CM | POA: Diagnosis not present

## 2015-02-02 DIAGNOSIS — O34219 Maternal care for unspecified type scar from previous cesarean delivery: Secondary | ICD-10-CM | POA: Diagnosis present

## 2015-02-02 DIAGNOSIS — O34211 Maternal care for low transverse scar from previous cesarean delivery: Principal | ICD-10-CM | POA: Diagnosis present

## 2015-02-02 DIAGNOSIS — Z3A39 39 weeks gestation of pregnancy: Secondary | ICD-10-CM | POA: Diagnosis not present

## 2015-02-02 DIAGNOSIS — O99013 Anemia complicating pregnancy, third trimester: Secondary | ICD-10-CM | POA: Diagnosis present

## 2015-02-02 DIAGNOSIS — D649 Anemia, unspecified: Secondary | ICD-10-CM | POA: Diagnosis not present

## 2015-02-02 DIAGNOSIS — O352XX0 Maternal care for (suspected) hereditary disease in fetus, not applicable or unspecified: Secondary | ICD-10-CM | POA: Diagnosis present

## 2015-02-02 DIAGNOSIS — Z3483 Encounter for supervision of other normal pregnancy, third trimester: Secondary | ICD-10-CM

## 2015-02-02 DIAGNOSIS — Z98891 History of uterine scar from previous surgery: Secondary | ICD-10-CM

## 2015-02-02 DIAGNOSIS — O289 Unspecified abnormal findings on antenatal screening of mother: Secondary | ICD-10-CM | POA: Diagnosis present

## 2015-02-02 LAB — RPR: RPR Ser Ql: NONREACTIVE

## 2015-02-02 SURGERY — Surgical Case
Anesthesia: Choice | Wound class: Clean Contaminated

## 2015-02-02 MED ORDER — MAGNESIUM HYDROXIDE 400 MG/5ML PO SUSP: 30.0000 mL | ORAL | Status: DC | PRN

## 2015-02-02 MED ORDER — ONDANSETRON HCL 4 MG/2ML IJ SOLN
4.0000 mg | Freq: Once | INTRAMUSCULAR | Status: AC | PRN
  Administered 2015-02-02: 4 mg via INTRAVENOUS
  Filled 2015-02-02: qty 2

## 2015-02-02 MED ORDER — LACTATED RINGERS IV SOLN
INTRAVENOUS | Status: DC
  Administered 2015-02-03: 125 mL/h via INTRAVENOUS
  Administered 2015-02-03: 11:00:00 via INTRAVENOUS

## 2015-02-02 MED ORDER — SIMETHICONE 80 MG PO CHEW
80.0000 mg | CHEWABLE_TABLET | ORAL | Status: DC
  Administered 2015-02-02 – 2015-02-04 (×2): 80 mg via ORAL
  Filled 2015-02-02: qty 1

## 2015-02-02 MED ORDER — OXYTOCIN 10 UNIT/ML IJ SOLN
40.0000 [IU] | INTRAVENOUS | Status: DC | PRN
  Administered 2015-02-02: 40 [IU] via INTRAVENOUS

## 2015-02-02 MED ORDER — CEFAZOLIN SODIUM 1-5 GM-% IV SOLN
1.0000 g | INTRAVENOUS | Status: AC
  Administered 2015-02-02 (×2): 1 g via INTRAVENOUS
  Filled 2015-02-02: qty 50

## 2015-02-02 MED ORDER — OXYTOCIN 40 UNITS IN LACTATED RINGERS INFUSION - SIMPLE MED: 62.5000 mL/h | INTRAVENOUS | Status: DC

## 2015-02-02 MED ORDER — OXYCODONE-ACETAMINOPHEN 5-325 MG PO TABS
2.0000 | ORAL_TABLET | ORAL | Status: DC | PRN
  Administered 2015-02-03: 2 via ORAL
  Administered 2015-02-04: 1 via ORAL
  Filled 2015-02-02 (×3): qty 2

## 2015-02-02 MED ORDER — DIBUCAINE 1 % RE OINT: 1.0000 "application " | TOPICAL_OINTMENT | RECTAL | Status: DC | PRN

## 2015-02-02 MED ORDER — LIDOCAINE 5 % EX PTCH
1.0000 | MEDICATED_PATCH | CUTANEOUS | Status: DC
  Administered 2015-02-03 – 2015-02-04 (×2): 1 via TRANSDERMAL
  Filled 2015-02-02 (×3): qty 1

## 2015-02-02 MED ORDER — MENTHOL 3 MG MT LOZG: 1.0000 | LOZENGE | OROMUCOSAL | Status: DC | PRN

## 2015-02-02 MED ORDER — BUPIVACAINE IN DEXTROSE 0.75-8.25 % IT SOLN
INTRATHECAL | Status: DC | PRN
  Administered 2015-02-02: 1.4 mL via INTRATHECAL

## 2015-02-02 MED ORDER — LANOLIN HYDROUS EX OINT: 1.0000 "application " | TOPICAL_OINTMENT | CUTANEOUS | Status: DC | PRN

## 2015-02-02 MED ORDER — ONDANSETRON HCL 4 MG/2ML IJ SOLN
INTRAMUSCULAR | Status: DC | PRN
  Administered 2015-02-02: 4 mg via INTRAVENOUS

## 2015-02-02 MED ORDER — LACTATED RINGERS IV SOLN
INTRAVENOUS | Status: DC
  Administered 2015-02-02 (×2): via INTRAVENOUS

## 2015-02-02 MED ORDER — LIDOCAINE 5 % EX PTCH
MEDICATED_PATCH | CUTANEOUS | Status: DC | PRN
  Administered 2015-02-02: 1 via TRANSDERMAL

## 2015-02-02 MED ORDER — WITCH HAZEL-GLYCERIN EX PADS: 1.0000 "application " | MEDICATED_PAD | CUTANEOUS | Status: DC | PRN

## 2015-02-02 MED ORDER — ONDANSETRON HCL 4 MG/2ML IJ SOLN: 4.0000 mg | Freq: Three times a day (TID) | INTRAMUSCULAR | Status: DC | PRN

## 2015-02-02 MED ORDER — SIMETHICONE 80 MG PO CHEW
80.0000 mg | CHEWABLE_TABLET | ORAL | Status: DC | PRN
  Administered 2015-02-03: 80 mg via ORAL
  Filled 2015-02-02 (×2): qty 1

## 2015-02-02 MED ORDER — LACTATED RINGERS IV SOLN
INTRAVENOUS | Status: DC
  Administered 2015-02-02: 11:00:00 via INTRAVENOUS

## 2015-02-02 MED ORDER — OXYCODONE-ACETAMINOPHEN 5-325 MG PO TABS
1.0000 | ORAL_TABLET | ORAL | Status: DC | PRN
  Administered 2015-02-02 – 2015-02-04 (×8): 1 via ORAL
  Filled 2015-02-02 (×6): qty 1

## 2015-02-02 MED ORDER — MORPHINE SULFATE (PF) 0.5 MG/ML IJ SOLN
INTRAMUSCULAR | Status: DC | PRN
  Administered 2015-02-02: .1 mg via INTRATHECAL

## 2015-02-02 MED ORDER — FENTANYL CITRATE (PF) 100 MCG/2ML IJ SOLN: 25.0000 ug | INTRAMUSCULAR | Status: DC | PRN

## 2015-02-02 MED ORDER — FERROUS SULFATE 325 (65 FE) MG PO TABS
325.0000 mg | ORAL_TABLET | Freq: Two times a day (BID) | ORAL | Status: DC
  Administered 2015-02-02 – 2015-02-04 (×4): 325 mg via ORAL
  Filled 2015-02-02 (×4): qty 1

## 2015-02-02 MED ORDER — SENNOSIDES-DOCUSATE SODIUM 8.6-50 MG PO TABS
2.0000 | ORAL_TABLET | ORAL | Status: DC
  Administered 2015-02-02: 2 via ORAL
  Filled 2015-02-02: qty 2

## 2015-02-02 MED ORDER — CITRIC ACID-SODIUM CITRATE 334-500 MG/5ML PO SOLN
30.0000 mL | ORAL | Status: AC
Start: 2015-02-02 — End: 2015-02-02
  Administered 2015-02-02: 30 mL via ORAL
  Filled 2015-02-02: qty 15

## 2015-02-02 MED ORDER — FENTANYL CITRATE (PF) 100 MCG/2ML IJ SOLN
INTRAMUSCULAR | Status: DC | PRN
Start: 2015-02-02 — End: 2015-02-02
  Administered 2015-02-02: 15 ug via INTRATHECAL

## 2015-02-02 MED ORDER — ACETAMINOPHEN 325 MG PO TABS: 650.0000 mg | ORAL_TABLET | ORAL | Status: DC | PRN

## 2015-02-02 MED ORDER — DIPHENHYDRAMINE HCL 25 MG PO CAPS
25.0000 mg | ORAL_CAPSULE | Freq: Four times a day (QID) | ORAL | Status: DC | PRN
  Filled 2015-02-02: qty 1

## 2015-02-02 MED ORDER — PHENYLEPHRINE HCL 10 MG/ML IJ SOLN
INTRAMUSCULAR | Status: DC | PRN
  Administered 2015-02-02 (×3): 100 ug via INTRAVENOUS

## 2015-02-02 MED ORDER — ZOLPIDEM TARTRATE 5 MG PO TABS: 5.0000 mg | ORAL_TABLET | Freq: Every evening | ORAL | Status: DC | PRN

## 2015-02-02 MED ORDER — PRENATAL MULTIVITAMIN CH
1.0000 | ORAL_TABLET | Freq: Every day | ORAL | Status: DC
  Administered 2015-02-03 – 2015-02-04 (×2): 1 via ORAL
  Filled 2015-02-02 (×2): qty 1

## 2015-02-02 MED ORDER — IBUPROFEN 600 MG PO TABS
600.0000 mg | ORAL_TABLET | Freq: Four times a day (QID) | ORAL | Status: DC
Start: 2015-02-02 — End: 2015-02-04
  Administered 2015-02-02 – 2015-02-04 (×8): 600 mg via ORAL
  Filled 2015-02-02 (×9): qty 1

## 2015-02-02 SURGICAL SUPPLY — 27 items
BAG COUNTER SPONGE EZ (MISCELLANEOUS) ×3 IMPLANT
BAG SPNG 4X4 CLR HAZ (MISCELLANEOUS) ×2
CANISTER SUCT 3000ML (MISCELLANEOUS) ×3 IMPLANT
CHLORAPREP W/TINT 26ML (MISCELLANEOUS) ×6 IMPLANT
COUNTER SPONGE BAG EZ (MISCELLANEOUS) ×2
DRSG TELFA 3X8 NADH (GAUZE/BANDAGES/DRESSINGS) ×3 IMPLANT
GAUZE SPONGE 4X4 12PLY STRL (GAUZE/BANDAGES/DRESSINGS) ×3 IMPLANT
GLOVE BIO SURGEON STRL SZ 6 (GLOVE) ×3 IMPLANT
GLOVE BIOGEL PI IND STRL 6.5 (GLOVE) ×1 IMPLANT
GLOVE BIOGEL PI INDICATOR 6.5 (GLOVE) ×2
GOWN STRL REUS W/ TWL LRG LVL3 (GOWN DISPOSABLE) ×2 IMPLANT
GOWN STRL REUS W/TWL LRG LVL3 (GOWN DISPOSABLE) ×6
KIT RM TURNOVER STRD PROC AR (KITS) ×3 IMPLANT
MARKER SKIN W/RULER 31145785 (MISCELLANEOUS) ×3 IMPLANT
NS IRRIG 1000ML POUR BTL (IV SOLUTION) ×3 IMPLANT
PACK C SECTION AR (MISCELLANEOUS) ×3 IMPLANT
PAD DRESSING TELFA 3X8 NADH (GAUZE/BANDAGES/DRESSINGS) ×1 IMPLANT
PAD GROUND ADULT SPLIT (MISCELLANEOUS) ×3 IMPLANT
PAD OB MATERNITY 4.3X12.25 (PERSONAL CARE ITEMS) ×3 IMPLANT
PAD PREP 24X41 OB/GYN DISP (PERSONAL CARE ITEMS) ×3 IMPLANT
SUT CHROMIC 0 CT 1 (SUTURE) IMPLANT
SUT MNCRL AB 4-0 PS2 18 (SUTURE) ×5 IMPLANT
SUT PLAIN 2 0 XLH (SUTURE) IMPLANT
SUT VIC AB 0 CT1 36 (SUTURE) ×6 IMPLANT
SUT VIC AB 1 CT1 36 (SUTURE) ×6 IMPLANT
SUT VIC AB 3-0 SH 27 (SUTURE) ×3
SUT VIC AB 3-0 SH 27X BRD (SUTURE) ×1 IMPLANT

## 2015-02-02 NOTE — Anesthesia Procedure Notes (Signed)
Spinal Patient location during procedure: OR Start time: 02/02/2015 12:22 PM End time: 02/02/2015 12:26 PM Staffing Anesthesiologist: Martha Clan Preanesthetic Checklist Completed: patient identified, site marked, surgical consent, pre-op evaluation, timeout performed, IV checked, risks and benefits discussed and monitors and equipment checked Spinal Block Patient position: sitting Prep: Betadine Patient monitoring: heart rate, continuous pulse ox, blood pressure and cardiac monitor Approach: midline Location: L4-5 Injection technique: single-shot Needle Needle type: Whitacre and Introducer  Needle gauge: 25 G Needle length: 9 cm Additional Notes Negative paresthesia. Negative blood return. Positive free-flowing CSF. Expiration date of kit checked and confirmed. Patient tolerated procedure well, without complications.

## 2015-02-02 NOTE — H&P (Signed)
UPDATE TO PREVIOUS HISTORY AND PHYSICAL  The patient has been seen and examined.  H&P is up to date, no changes noted.  Patient is a G2P1001 femalecurrently at 6218w2d scheduled for repeat C-section.  Has been previously counseled on risks/benefits/alternatives of procedure.  Patient can proceed to the OR for scheduled procedure when ready.   Hildred LaserAnika Tamim Skog, MD 02/02/2015 12:14 PM

## 2015-02-02 NOTE — Transfer of Care (Signed)
Immediate Anesthesia Transfer of Care Note  Patient: Taylor [name]  Procedure(s) Performed: Procedure(s): CESAREAN SECTION (N/A)  Patient Location: PACU  Anesthesia Type:Spinal  Level of Consciousness: awake  Airway & Oxygen Therapy: Patient Spontanous Breathing  Post-op Assessment: Report given to RN and Post -op Vital signs reviewed and stable  Post vital signs: Reviewed and stable  Last Vitals:  Filed Vitals:   02/02/15 1341  BP: 99/44  Pulse: 78  Temp: 36.3 C  Resp: 16    Complications: No apparent anesthesia complications

## 2015-02-02 NOTE — Op Note (Addendum)
Cesarean Section Procedure Note  Indications: previous uterine incision low transverse  x 1  Pre-operative Diagnosis: 39 week 2 day pregnancy, h/o prior C-section x1, AFI elevated (polyhydramnios), h/o prior infant with congenital anomalies.  Post-operative Diagnosis: same  Surgeon: Hildred LaserAnika Fe Okubo, MD  Assistants: None  Procedure: Repeat low transverse Cesarean Section  Anesthesia: Spinal anesthesia  ASA Class: I  Procedure Details: The patient was seen in the Holding Room. The risks, benefits, complications, treatment options, and expected outcomes were discussed with the patient.  The patient concurred with the proposed plan, giving informed consent.  The site of surgery properly noted/marked. The patient was taken to the Operating Room, identified as Taylor [name] and the procedure verified as C-Section Delivery. A Time Out was held and the above information confirmed.  After induction of anesthesia, the patient was draped and prepped in the usual sterile manner. Anesthesia was tested and noted to be adequate. A Pfannenstiel incision was made and carried down through the subcutaneous tissue to the fascia. Fascial incision was made and extended transversely. The fascia was separated from the underlying rectus tissue superiorly and inferiorly. The peritoneum was identified and entered. Peritoneal incision was extended longitudinally. The utero-vesical peritoneal reflection was incised transversely and the bladder flap was bluntly freed from the lower uterine segment. A low transverse uterine incision was made. Delivered from cephalic presentation was a 3730 gram Female with Apgar scores of 6 at one minute and 9 at five minutes.After the umbilical cord was clamped and cut cord blood and gas were obtained for evaluation. The placenta was removed intact and appeared normal. The uterus was exteriorized and cleared of all clots and debris. The uterine outline, tubes and ovaries appeared normal.   The uterine incision was closed with running locked sutures of 0-Vicryl.  A second suture of 0-Vicryl was used in an imbricating layer.  Hemostasis was observed.The pericolic gutters were cleared of all clots and debris. The fascia was then reapproximated with a running suture of 1-0 Vicryl. The skin was reapproximated with 4-0 Monocryl.  Instrument, sponge, and needle counts were correct prior the abdominal closure and at the conclusion of the case.   Findings: Female/Female infant, cephalic presentation, 3730 grams, with Apgar scores of 6 at one minute and 9 at five minutes. Intact placenta with 3 vessel cord.  The uterine outline, tubes and ovaries appeared normal.   Estimated Blood Loss:  700 ml      Drains: foley catheter to gravity drainage, 100 ml of clear urine at end of the procedure         Total IV Fluids:  1900 ml  Specimens: Placenta and Disposition:  Sent to Pathology         Implants: None         Complications:  None; patient tolerated the procedure well.         Disposition: PACU - hemodynamically stable.         Condition: stable    Hildred LaserAnika Haydn Hutsell, MD [facility]

## 2015-02-03 LAB — CBC
HEMATOCRIT: 27 % — AB (ref 35.0–47.0)
Hemoglobin: 9.1 g/dL — ABNORMAL LOW (ref 12.0–16.0)
MCH: 30.3 pg (ref 26.0–34.0)
MCHC: 33.8 g/dL (ref 32.0–36.0)
MCV: 89.7 fL (ref 80.0–100.0)
PLATELETS: 104 10*3/uL — AB (ref 150–440)
RBC: 3.01 MIL/uL — ABNORMAL LOW (ref 3.80–5.20)
RDW: 14.7 % — AB (ref 11.5–14.5)
WBC: 10 10*3/uL (ref 3.6–11.0)

## 2015-02-03 NOTE — Anesthesia Preprocedure Evaluation (Addendum)
Anesthesia Evaluation    Airway Mallampati: II       Dental   Pulmonary           Cardiovascular      Neuro/Psych    GI/Hepatic   Endo/Other    Renal/GU      Musculoskeletal   Abdominal   Peds  Hematology   Anesthesia Other Findings   Reproductive/Obstetrics                             Anesthesia Physical Anesthesia Plan  ASA: II  Anesthesia Plan: Spinal   Post-op Pain Management:    Induction:   Airway Management Planned:   Additional Equipment:   Intra-op Plan:   Post-operative Plan:   Informed Consent:   Plan Discussed with:   Anesthesia Plan Comments:         Anesthesia Quick Evaluation

## 2015-02-03 NOTE — Anesthesia Post-op Follow-up Note (Signed)
  Anesthesia Pain Follow-up Note  Patient: Taylor [name]  Day #: 1  Date of Follow-up: 02/03/2015 Time: 7:06 AM  Last Vitals:  Filed Vitals:   02/03/15 0325  BP: 112/60  Pulse: 76  Temp: 36.9 C  Resp: 18    Level of Consciousness: alert  Pain: none   Side Effects:Pruritis  Catheter Site Exam:clean  Plan: Continue current therapy  Masco CorporationStephanie Saverio Kader

## 2015-02-03 NOTE — Anesthesia Postprocedure Evaluation (Signed)
  Anesthesia Post-op Note  Patient: Taylor [name]  Procedure(s) Performed: Procedure(s): CESAREAN SECTION (N/A)  Anesthesia type: spinal  Patient location: PACU  Post pain: Pain level controlled  Post assessment: Post-op Vital signs reviewed, Patient's Cardiovascular Status Stable, Respiratory Function Stable, Patent Airway and No signs of Nausea or vomiting  Post vital signs: Reviewed and stable  Last Vitals:  Filed Vitals:   02/03/15 0325  BP: 112/60  Pulse: 76  Temp: 36.9 C  Resp: 18    Level of consciousness: awake, alert  and patient cooperative  Complications: No apparent anesthesia complications

## 2015-02-03 NOTE — Progress Notes (Signed)
Postpartum Day 1: Cesarean Delivery  Subjective: Patient reports nausea, tolerating PO, + flatus and no problems voiding.    Objective: Vital signs in last 24 hours: Temp:  [97.4 F (36.3 C)-98.6 F (37 C)] 98.4 F (36.9 C) (11/11 0325) Pulse Rate:  [63-101] 76 (11/11 0325) Resp:  [16-18] 18 (11/11 0325) BP: (99-127)/(44-84) 112/60 mmHg (11/11 0325) SpO2:  [96 %-100 %] 97 % (11/11 0325) Weight:  [137 lb (62.143 kg)] 137 lb (62.143 kg) (11/10 1020)  Physical Exam:  General: alert and no distress Lochia: appropriate Uterine Fundus: firm Incision: bandage in place, clean/dry/intact. DVT Evaluation: Negative Homan's sign.  No cords or calf tenderness. No significant calf/ankle edema. SCDs in place   Recent Labs  02/01/15 0903 02/03/15 0612  HGB 9.9* 9.1*  HCT 29.7* 27.0*    Assessment/Plan: Status post Cesarean section. Doing well postoperatively.  Continue current care. Encourage ambulation Advance diet as tolerated Iron sulfate for mild anemia (asymptomatic) Dispo: likely d/c home in 1-2 days.   Hildred Lasernika Anea Fodera 02/03/2015, 8:19 AM

## 2015-02-04 MED ORDER — IBUPROFEN 800 MG PO TABS: 800.0000 mg | ORAL_TABLET | Freq: Three times a day (TID) | ORAL | Status: DC | PRN

## 2015-02-04 MED ORDER — DOCUSATE SODIUM 100 MG PO CAPS: 100.0000 mg | ORAL_CAPSULE | Freq: Two times a day (BID) | ORAL | Status: DC | PRN

## 2015-02-04 MED ORDER — OXYCODONE-ACETAMINOPHEN 5-325 MG PO TABS
1.0000 | ORAL_TABLET | Freq: Four times a day (QID) | ORAL | Status: DC | PRN
Start: 2015-02-04 — End: 2015-02-14

## 2015-02-04 NOTE — Progress Notes (Signed)
Prenatal labs indicate that pt needs MMR and Varicella vaccines.  Pt educated on need for vaccines.  Pt declines all vaccines at this time. Reed Breech, RN 02/04/2015 3:12 PM

## 2015-02-04 NOTE — Progress Notes (Signed)
Postpartum Day 2: Cesarean Delivery  Subjective: Patient reports tolerating PO, + flatus, + BM and no problems voiding.  Mild incisional pain noted.  Objective: Vital signs in last 24 hours: Temp:  [98 F (36.7 C)-99 F (37.2 C)] 98.1 F (36.7 C) (11/12 0859) Pulse Rate:  [78-90] 90 (11/12 0859) Resp:  [18-20] 18 (11/12 0859) BP: (112-120)/(63-76) 116/63 mmHg (11/12 0859) SpO2:  [97 %] 97 % (11/12 0859)  Physical Exam:  General: alert and no distress Lochia: appropriate Uterine Fundus: firm Incision: bandage in place, clean/dry/intact. DVT Evaluation: Negative Homan's sign.  No cords or calf tenderness. No significant calf/ankle edema. SCDs in place   CBC Latest Ref Rng 02/03/2015 02/01/2015  WBC 3.6 - 11.0 K/uL 10.0 8.9  Hemoglobin 12.0 - 16.0 g/dL 4.5(W9.1(L) 0.9(W9.9(L)  Hematocrit 35.0 - 47.0 % 27.0(L) 29.7(L)  Platelets 150 - 440 K/uL 104(L) 134(L)    Assessment/Plan: Status post Cesarean section. Doing well postoperatively.  Continue current care. Continue to encourage ambulation Regular diet as tolerated Iron sulfate for mild anemia (asymptomatic) Desires circumcision for female infant. Dispo: d/c home today.   Taylor [name] Taylor [name] 02/04/2015, 9:35 AM

## 2015-02-04 NOTE — Progress Notes (Signed)
Discharge instructions provided.  Pt and sig other verbalize understanding of all instructions and follow-up care.  Prescriptions and Incision Hygiene Kit given.  Pt discharged to home with infant at 1635 on 02/04/15 via wheelchair by RN. Reed Breech, RN 02/04/2015 4:53 PM

## 2015-02-04 NOTE — Discharge Instructions (Signed)
Cesarean Delivery, Care After Refer to this sheet in the next few weeks. These instructions provide you with information on caring for yourself after your procedure. Your health care provider may also give you specific instructions. Your treatment has been planned according to current medical practices, but problems sometimes occur. Call your health care provider if you have any problems or questions after you go home. HOME CARE INSTRUCTIONS  Only take over-the-counter or prescription medications as directed by your health care provider.  Do not drink alcohol, especially if you are breastfeeding or taking medication to relieve pain.  Do not chew or smoke tobacco.  Continue to use good perineal care. Good perineal care includes:  Wiping your perineum from front to back.  Keeping your perineum clean.  Check your surgical cut (incision) daily for increased redness, drainage, swelling, or separation of skin.  Clean your incision gently with soap and water every day, and then pat it dry. If your health care provider says it is okay, leave the incision uncovered. Use a bandage (dressing) if the incision is draining fluid or appears irritated. If the adhesive strips across the incision do not fall off within 7 days, carefully peel them off.  Hug a pillow when coughing or sneezing until your incision is healed. This helps to relieve pain.  Do not use tampons or douche until your health care provider says it is okay.  Shower, wash your hair, and take tub baths as directed by your health care provider.  Wear a well-fitting bra that provides breast support.  Limit wearing support panties or control-top hose.  Drink enough fluids to keep your urine clear or pale yellow.  Eat high-fiber foods such as whole grain cereals and breads, brown rice, beans, and fresh fruits and vegetables every day. These foods may help prevent or relieve constipation.  Resume activities such as climbing stairs,  driving, lifting, exercising, or traveling as directed by your health care provider.  Talk to your health care provider about resuming sexual activities. This is dependent upon your risk of infection, your rate of healing, and your comfort and desire to resume sexual activity.  Pelvic rest advised for 6 weeks.   Try to have someone help you with your household activities and your newborn for at least a few days after you leave the hospital.  Rest as much as possible. Try to rest or take a nap when your newborn is sleeping.  Increase your activities gradually.  Keep all of your scheduled postpartum appointments. It is very important to keep your scheduled follow-up appointments. At these appointments, your health care provider will be checking to make sure that you are healing physically and emotionally. SEEK MEDICAL CARE IF:   You are passing large clots from your vagina. Save any clots to show your health care provider.  You have a foul smelling discharge from your vagina.  You have trouble urinating.  You are urinating frequently.  You have pain when you urinate.  You have a change in your bowel movements.  You have increasing redness, pain, or swelling near your incision.  You have pus draining from your incision.  Your incision is separating.  You have painful, hard, or reddened breasts.  You have a severe headache.  You have blurred vision or see spots.  You feel sad or depressed.  You have thoughts of hurting yourself or your newborn.  You have questions about your care, the care of your newborn, or medications.  You are dizzy or  light-headed.  You have a rash.  You have pain, redness, or swelling at the site of the removed intravenous access (IV) tube.  You have nausea or vomiting.  You stopped breastfeeding and have not had a menstrual period within 12 weeks of stopping.  You are not breastfeeding and have not had a menstrual period within 12 weeks of  delivery.  You have a fever. SEEK IMMEDIATE MEDICAL CARE IF:  You have persistent pain.  You have chest pain.  You have shortness of breath.  You faint.  You have leg pain.  You have stomach pain.  Your vaginal bleeding saturates 2 or more sanitary pads in 1 hour. MAKE SURE YOU:   Understand these instructions.  Will watch your condition.  Will get help right away if you are not doing well or get worse.   This information is not intended to replace advice given to you by your health care provider. Make sure you discuss any questions you have with your health care provider.   Document Released: 12/01/2001 Document Revised: 04/01/2014 Document Reviewed: 11/06/2011 Elsevier Interactive Patient Education 2016 [employer]  Call your doctor for increased pain or vaginal bleeding, temperature above 100.4, depression, or concerns.  No strenuous activity or heavy lifting for 6 weeks.  No intercourse, tampons, douching, or enemas for 6 weeks.  No tub baths-showers only.  No driving for 2 weeks or while taking pain medications.  Continue prenatal vitamin and iron.  Keep incision clean and dry.  Call your doctor for incision concerns including redness, swelling, bleeding or drainage, or if begins to come apart.

## 2015-02-04 NOTE — Discharge Summary (Signed)
Obstetric Discharge Summary Reason for Admission: cesarean section Prenatal Procedures: NST and ultrasound Intrapartum Procedures: cesarean: low cervical, transverse Postpartum Procedures: none Complications-Operative and Postpartum: none HEMOGLOBIN  Date Value Ref Range Status  02/03/2015 9.1* 12.0 - 16.0 g/dL Final  78/29/562104/29/2016 30.812.1 g/dL Final   HCT  Date Value Ref Range Status  02/03/2015 27.0* 35.0 - 47.0 % Final  07/22/2014 35 % Final   HEMATOCRIT  Date Value Ref Range Status  11/17/2014 28.4* 34.0 - 46.6 % Final    Physical Exam:  General: alert and no distress Lochia: appropriate Uterine Fundus: firm Incision: healing well, no significant drainage, no dehiscence, no significant erythema DVT Evaluation: Negative Homan's sign. No cords or calf tenderness. No significant calf/ankle edema.  Discharge Diagnoses: Term Pregnancy-delivered and h/o prior c-section x 1, Polyhydramnios  Discharge Information: Date: 02/04/2015 Activity: pelvic rest Diet: routine Medications: PNV, Ibuprofen, Colace, Iron and Percocet Condition: stable Instructions: refer to practice specific booklet Discharge to: home Follow-up Information    Follow up with Taylor LaserAnika Jshaun Abernathy, MD In 1 week.   Specialties:  Obstetrics and Gynecology, Radiology   Why:  For wound re-check   Contact information:   [address] [place] [address] [phone]       Newborn Data: Live born female  Birth Weight: 8 lb 3.6 oz (3730 g) APGAR: 6, 9  Home with mother.  Taylor [name] 02/04/2015, 9:43 AM

## 2015-02-14 ENCOUNTER — Ambulatory Visit (INDEPENDENT_AMBULATORY_CARE_PROVIDER_SITE_OTHER): Payer: Medicaid Other | Admitting: Obstetrics and Gynecology

## 2015-02-14 VITALS — BP 132/86 | HR 93 | Ht 60.0 in | Wt 117.6 lb

## 2015-02-14 DIAGNOSIS — Z98891 History of uterine scar from previous surgery: Secondary | ICD-10-CM

## 2015-02-14 DIAGNOSIS — Z9889 Other specified postprocedural states: Secondary | ICD-10-CM

## 2015-02-14 MED ORDER — OXYCODONE-ACETAMINOPHEN 5-325 MG PO TABS: 1.0000 | ORAL_TABLET | Freq: Four times a day (QID) | ORAL | Status: DC | PRN

## 2015-02-14 NOTE — Progress Notes (Signed)
Subjective:     Taylor [name] is a 34 y.o. 442P2002 female who presents to the clinic 1 weeks status post repeat low transverse C-section for history of prior C-section, declined TOLAC. Eating a regular diet without difficulty. Bowel movements are normal. Pain is controlled with current analgesics. Medications being used: prescription NSAID's including Ibuprofen and narcotic analgesics including oxycodone/acetaminophen (Percocet, Tylox).  The following portions of the patient's history were reviewed and updated as appropriate: allergies, current medications, past family history, past medical history, past social history, past surgical history and problem list.  Review of Systems Pertinent items noted in HPI and remainder of comprehensive ROS otherwise negative.    Objective:    BP 132/86 mmHg  Pulse 93  Ht 5' (1.524 m)  Wt 117 lb 9.6 oz (53.343 kg)  BMI 22.97 kg/m2  LMP 05/03/2014  Breastfeeding? No General:  alert and no distress  Abdomen: soft, bowel sounds active, non-tender  Incision:   healing well, no drainage, no erythema, no hernia, no seroma, no swelling, no dehiscence, incision well approximated       Assessment:    Doing well postoperatively.  Plan:    1. Continue any current medications.  Patient request refill as she is having to move and be more active as husband has had to resume work sooner than expected, so patient now taking care of 2 kids.  Prescription given.  2. Wound care discussed. 3. Activity restrictions: no bending, stooping, or squatting, no lifting more than 15 pounds and pelvic rest x 5 weeks 4. Anticipated return to work: in 5 weeks. 5. Follow up: 5 weeks for postpartum visit.       Taylor LaserAnika Ayomide Zuleta, MD [facility]

## 2015-03-21 ENCOUNTER — Ambulatory Visit (INDEPENDENT_AMBULATORY_CARE_PROVIDER_SITE_OTHER): Payer: Medicaid Other | Admitting: Obstetrics and Gynecology

## 2015-03-21 ENCOUNTER — Encounter: Payer: Self-pay | Admitting: Obstetrics and Gynecology

## 2015-03-21 VITALS — BP 117/81 | HR 109 | Ht 60.0 in | Wt 109.5 lb

## 2015-03-21 DIAGNOSIS — O9902 Anemia complicating childbirth: Secondary | ICD-10-CM

## 2015-03-21 MED ORDER — NORETHIN ACE-ETH ESTRAD-FE 1-20 MG-MCG PO TABS: 1.0000 | ORAL_TABLET | Freq: Every day | ORAL | Status: DC

## 2015-03-21 NOTE — Progress Notes (Signed)
Subjective:     Taylor [name] is a 34 y.o. 742P2002 female who presents for a postpartum visit. She is 6 weeks postpartum following a low cervical transverse Cesarean section. I have fully reviewed the prenatal and intrapartum course. The delivery was at 39 gestational weeks. Anesthesia: spinal. Postpartum course has been well. Baby's course has been well. Baby is feeding by bottle. Bleeding has had LMP 03/13/2015. Bowel function is normal. Bladder function is normal. Patient is not sexually active. Contraception method is OCP (estrogen/progesterone). Postpartum depression screening: negative.  The following portions of the patient's history were reviewed and updated as appropriate: allergies, current medications, past family history, past medical history, past social history, past surgical history and problem list.  Review of Systems A comprehensive review of systems was negative.   Objective:    BP 117/81 mmHg  Pulse 109  Ht 5' (1.524 m)  Wt 109 lb 8 oz (49.669 kg)  BMI 21.39 kg/m2  LMP 03/21/2015  Breastfeeding? No  General:  alert and no distress   Breasts:  inspection negative, no nipple discharge or bleeding, no masses or nodularity palpable  Lungs: clear to auscultation bilaterally  Heart:  regular rate and rhythm, S1, S2 normal, no murmur, click, rub or gallop  Abdomen: soft, non-tender; bowel sounds normal; no masses,  no organomegaly.  Incision site well healed.   Vulva:  normal  Vagina: normal vagina, no discharge, exudate, lesion, or erythema  Cervix:  no cervical motion tenderness, no lesions and nulliparous appearance  Corpus: normal size, contour, position, consistency, mobility, non-tender  Adnexa:  normal adnexa and no mass, fullness, tenderness  Rectal Exam: Not performed.         Labs:  Lab Results  Component Value Date   HGB 9.1* 02/03/2015    Assessment:   Routine postpartum exam. Pap smear not done at today's visit.  Mild anemia of pregnancy  Plan:     1. Contraception: OCP (estrogen/progesterone).  Prescribed Junel.  2. Hgb to be checked today. Patient reports noncompliance with iron pills.  3. Follow up in: 3-6 months for annual exam, or as needed.     Hildred LaserAnika Lavere Shinsky, MD [facility]

## 2015-03-22 LAB — HEMOGLOBIN AND HEMATOCRIT, BLOOD
HEMATOCRIT: 38.1 % (ref 34.0–46.6)
HEMOGLOBIN: 12.6 g/dL (ref 11.1–15.9)

## 2015-09-19 ENCOUNTER — Encounter: Payer: Medicaid Other | Admitting: Obstetrics and Gynecology

## 2015-10-31 ENCOUNTER — Ambulatory Visit (INDEPENDENT_AMBULATORY_CARE_PROVIDER_SITE_OTHER): Payer: Medicaid Other | Admitting: Obstetrics and Gynecology

## 2015-10-31 ENCOUNTER — Encounter: Payer: Self-pay | Admitting: Obstetrics and Gynecology

## 2015-10-31 VITALS — BP 154/88 | HR 86 | Ht 60.0 in | Wt 109.4 lb

## 2015-10-31 DIAGNOSIS — Z8742 Personal history of other diseases of the female genital tract: Secondary | ICD-10-CM

## 2015-10-31 DIAGNOSIS — R030 Elevated blood-pressure reading, without diagnosis of hypertension: Secondary | ICD-10-CM | POA: Diagnosis not present

## 2015-10-31 DIAGNOSIS — Z0001 Encounter for general adult medical examination with abnormal findings: Secondary | ICD-10-CM

## 2015-10-31 DIAGNOSIS — Z01419 Encounter for gynecological examination (general) (routine) without abnormal findings: Secondary | ICD-10-CM

## 2015-10-31 DIAGNOSIS — IMO0001 Reserved for inherently not codable concepts without codable children: Secondary | ICD-10-CM

## 2015-10-31 NOTE — Progress Notes (Signed)
GYNECOLOGY ANNUAL PHYSICAL EXAM PROGRESS NOTE  Subjective:    Taylor [name] is a 35 y.o. 662P2002 single Caucasian female who presents for an annual exam. The patient has no complaints today. The patient is sexually active. The patient wears seatbelts: yes. The patient participates in regular exercise: no. Has the patient ever been transfused or tattooed?: no. The patient reports that there is not domestic violence in her life.    Gynecologic History Menarche age: [age]  Patient's last menstrual period was 10/01/2015 (approximate). Contraception: OCP (estrogen/progesterone) History of STI's: Denies Last Pap: 05/02/2014. Results were: normal.  Notes h/o abnormal pap smear x 1 in 2013, has had 3 subsequent pap smears that were normal.   Obstetric History   G2   P2   T2   P0   A0   L2    SAB0   TAB0   Ectopic0   Multiple0   Live Births2     # Outcome Date GA Lbr Len/2nd Weight Sex Delivery Anes PTL Lv  2 Term 02/02/15 5813w2d  8 lb 3.6 oz (3.73 kg) M CS-LTranv Spinal  LIV     Name: [name],BOY Shareen     Apgar1:  6                Apgar5: 9  1 Term 2005   6 lb 8 oz (2.948 kg) F CS-Unspec   LIV      Past Medical History:  Diagnosis Date  . History of cesarean section   . Need for Tdap vaccination   . Vaginal Pap smear, abnormal 2013   colpo neg last3 normal    Past Surgical History:  Procedure Laterality Date  . CESAREAN SECTION  2005  . CESAREAN SECTION N/A 02/02/2015   Procedure: CESAREAN SECTION;  Surgeon: Hildred LaserAnika Cristino Degroff, MD;  Location: [facility] ORS;  Service: Obstetrics;  Laterality: N/A;  . HALLUX VALGUS CORRECTION Bilateral 2000   both feet done in 2000 at different times    Family History  Problem Relation Age of Onset  . Hyperlipidemia Mother   . Hearing loss Daughter   . Kidney disease Daughter   . Diabetes Neg Hx   . Heart disease Neg Hx   . Breast cancer Neg Hx   . Ovarian cancer Neg Hx     Social History   Social History  . Marital status: Single   Spouse name: N/A  . Number of children: N/A  . Years of education: N/A   Occupational History  . Not on file.   Social History Main Topics  . Smoking status: Never Smoker  . Smokeless tobacco: Never Used  . Alcohol use No  . Drug use: No  . Sexual activity: Yes    Birth control/ protection: Pill     Comment: Preganant   Other Topics Concern  . Not on file   Social History Narrative  . No narrative on file    Current Outpatient Prescriptions on File Prior to Visit  Medication Sig Dispense Refill  . norethindrone-ethinyl estradiol (JUNEL FE 1/20) 1-20 MG-MCG tablet Take 1 tablet by mouth daily. 1 Package 11   No current facility-administered medications on file prior to visit.     No Known Allergies   Review of Systems Constitutional: negative for chills, fatigue, fevers and sweats Eyes: negative for irritation, redness and visual disturbance Ears, nose, mouth, throat, and face: negative for hearing loss, nasal congestion, snoring and tinnitus Respiratory: negative for asthma, cough, sputum Cardiovascular: negative for chest  pain, dyspnea, exertional chest pressure/discomfort, irregular heart beat, palpitations and syncope Gastrointestinal: negative for abdominal pain, change in bowel habits, nausea and vomiting Genitourinary: negative for abnormal menstrual periods, genital lesions, sexual problems and vaginal discharge, dysuria and urinary incontinence Integument/breast: negative for breast lump, breast tenderness and nipple discharge Hematologic/lymphatic: negative for bleeding and easy bruising Musculoskeletal:negative for back pain and muscle weakness Neurological: negative for dizziness, headaches, vertigo and weakness Endocrine: negative for diabetic symptoms including polydipsia, polyuria and skin dryness Allergic/Immunologic: negative for hay fever and urticaria     Objective:  Blood pressure (!) 154/88, pulse 86, height 5' (1.524 m), weight 109 lb 6.4 oz  (49.6 kg), last menstrual period 10/01/2015, not currently breastfeeding. Body mass index is 21.37 kg/m.  Repeat BP 132/75  General Appearance:    Alert, cooperative,  appears stated age  Head:    Normocephalic, without obvious abnormality, atraumatic  Eyes:    PERRL, conjunctiva/corneas clear, EOM's intact, both eyes  Ears:    Normal external ear canals, both ears  Nose:   Nares normal, septum midline, mucosa normal, no drainage or sinus tenderness  Throat:   Lips, mucosa, and tongue normal; teeth and gums normal  Neck:   Supple, symmetrical, trachea midline, no adenopathy; thyroid: no enlargement/tenderness/nodules; no carotid bruit or JVD  Back:     Symmetric, no curvature, ROM normal, no CVA tenderness  Lungs:     Clear to auscultation bilaterally, respirations unlabored  Chest Wall:    No tenderness or deformity   Heart:    Regular rate and rhythm, S1 and S2 normal, no murmur, rub or gallop  Breast Exam:    No tenderness, masses, or nipple abnormality  Abdomen:     Soft, non-tender, bowel sounds active all four quadrants, no masses, no organomegaly.  Well healed Pfannenstiel incision.   Genitalia:    Pelvic:external genitalia normal, vagina without lesions, discharge, or tenderness, rectovaginal septum  normal. Cervix normal in appearance, no cervical motion tenderness, no adnexal masses or tenderness.  Uterus normal size, shape, mobile, regular contours, nontender.  Rectal:    Normal external sphincter.  No hemorrhoids appreciated. Internal exam not done.   Extremities:   Extremities normal, atraumatic, no cyanosis or edema  Pulses:   2+ and symmetric all extremities  Skin:   Skin color, texture, turgor normal, no rashes or lesions  Lymph nodes:   Cervical, supraclavicular, and axillary nodes normal  Neurologic:   CNII-XII intact, normal strength, sensation and reflexes throughout    Assessment:   Healthy female exam.  Elevated BP  Plan:    - Blood tests: CBC with diff,  Comprehensive metabolic panel and Glucose (random). - Breast self exam technique reviewed and patient encouraged to perform self-exam monthly. - Contraception: OCP (estrogen/progesterone) currently, but is considering switching to LARC method.  Handouts given on different LARC methods, patient to call office if she desires to proceed. - Discussed healthy lifestyle modifications.   - Pap smear up to date.  H/o abnormal pap with 3 subsequent normal pap smears. Can now go q 3-5 years for screening.  - Elevated BP with normal repeat.  No prior h/o elevated BPs. Will monitor.   - Follow up in 1 year, or as needed.     Hildred Laser, MD [facility]

## 2015-10-31 NOTE — Patient Instructions (Signed)
Health Maintenance, Female Adopting a healthy lifestyle and getting preventive care can go a long way to promote health and wellness. Talk with your health care provider about what schedule of regular examinations is right for you. This is a good chance for you to check in with your provider about disease prevention and staying healthy. In between checkups, there are plenty of things you can do on your own. Experts have done a lot of research about which lifestyle changes and preventive measures are most likely to keep you healthy. Ask your health care provider for more information. WEIGHT AND DIET  Eat a healthy diet  Be sure to include plenty of vegetables, fruits, low-fat dairy products, and lean protein.  Do not eat a lot of foods high in solid fats, added sugars, or salt.  Get regular exercise. This is one of the most important things you can do for your health.  Most adults should exercise for at least 150 minutes each week. The exercise should increase your heart rate and make you sweat (moderate-intensity exercise).  Most adults should also do strengthening exercises at least twice a week. This is in addition to the moderate-intensity exercise.  Maintain a healthy weight  Body mass index (BMI) is a measurement that can be used to identify possible weight problems. It estimates body fat based on height and weight. Your health care provider can help determine your BMI and help you achieve or maintain a healthy weight.  For females 20 years of age and older:   A BMI below 18.5 is considered underweight.  A BMI of 18.5 to 24.9 is normal.  A BMI of 25 to 29.9 is considered overweight.  A BMI of 30 and above is considered obese.  Watch levels of cholesterol and blood lipids  You should start having your blood tested for lipids and cholesterol at 35 years of age, then have this test every 5 years.  You may need to have your cholesterol levels checked more often if:  Your lipid  or cholesterol levels are high.  You are older than 35 years of age.  You are at high risk for heart disease.  CANCER SCREENING   Lung Cancer  Lung cancer screening is recommended for adults 55-80 years old who are at high risk for lung cancer because of a history of smoking.  A yearly low-dose CT scan of the lungs is recommended for people who:  Currently smoke.  Have quit within the past 15 years.  Have at least a 30-pack-year history of smoking. A pack year is smoking an average of one pack of cigarettes a day for 1 year.  Yearly screening should continue until it has been 15 years since you quit.  Yearly screening should stop if you develop a health problem that would prevent you from having lung cancer treatment.  Breast Cancer  Practice breast self-awareness. This means understanding how your breasts normally appear and feel.  It also means doing regular breast self-exams. Let your health care provider know about any changes, no matter how small.  If you are in your 20s or 30s, you should have a clinical breast exam (CBE) by a health care provider every 1-3 years as part of a regular health exam.  If you are 40 or older, have a CBE every year. Also consider having a breast X-ray (mammogram) every year.  If you have a family history of breast cancer, talk to your health care provider about genetic screening.  If you   are at high risk for breast cancer, talk to your health care provider about having an MRI and a mammogram every year.  Breast cancer gene (BRCA) assessment is recommended for women who have family members with BRCA-related cancers. BRCA-related cancers include:  Breast.  Ovarian.  Tubal.  Peritoneal cancers.  Results of the assessment will determine the need for genetic counseling and BRCA1 and BRCA2 testing. Cervical Cancer Your health care provider may recommend that you be screened regularly for cancer of the pelvic organs (ovaries, uterus, and  vagina). This screening involves a pelvic examination, including checking for microscopic changes to the surface of your cervix (Pap test). You may be encouraged to have this screening done every 3 years, beginning at age 21.  For women ages 30-65, health care providers may recommend pelvic exams and Pap testing every 3 years, or they may recommend the Pap and pelvic exam, combined with testing for human papilloma virus (HPV), every 5 years. Some types of HPV increase your risk of cervical cancer. Testing for HPV may also be done on women of any age with unclear Pap test results.  Other health care providers may not recommend any screening for nonpregnant women who are considered low risk for pelvic cancer and who do not have symptoms. Ask your health care provider if a screening pelvic exam is right for you.  If you have had past treatment for cervical cancer or a condition that could lead to cancer, you need Pap tests and screening for cancer for at least 20 years after your treatment. If Pap tests have been discontinued, your risk factors (such as having a new sexual partner) need to be reassessed to determine if screening should resume. Some women have medical problems that increase the chance of getting cervical cancer. In these cases, your health care provider may recommend more frequent screening and Pap tests. Colorectal Cancer  This type of cancer can be detected and often prevented.  Routine colorectal cancer screening usually begins at 35 years of age and continues through 35 years of age.  Your health care provider may recommend screening at an earlier age if you have risk factors for colon cancer.  Your health care provider may also recommend using home test kits to check for hidden blood in the stool.  A small camera at the end of a tube can be used to examine your colon directly (sigmoidoscopy or colonoscopy). This is done to check for the earliest forms of colorectal  cancer.  Routine screening usually begins at age 50.  Direct examination of the colon should be repeated every 5-10 years through 35 years of age. However, you may need to be screened more often if early forms of precancerous polyps or small growths are found. Skin Cancer  Check your skin from head to toe regularly.  Tell your health care provider about any new moles or changes in moles, especially if there is a change in a mole's shape or color.  Also tell your health care provider if you have a mole that is larger than the size of a pencil eraser.  Always use sunscreen. Apply sunscreen liberally and repeatedly throughout the day.  Protect yourself by wearing long sleeves, pants, a wide-brimmed hat, and sunglasses whenever you are outside. HEART DISEASE, DIABETES, AND HIGH BLOOD PRESSURE   High blood pressure causes heart disease and increases the risk of stroke. High blood pressure is more likely to develop in:  People who have blood pressure in the high end   of the normal range (130-139/85-89 mm Hg).  People who are overweight or obese.  People who are African American.  If you are 38-23 years of age, have your blood pressure checked every 3-5 years. If you are 61 years of age or older, have your blood pressure checked every year. You should have your blood pressure measured twice--once when you are at a hospital or clinic, and once when you are not at a hospital or clinic. Record the average of the two measurements. To check your blood pressure when you are not at a hospital or clinic, you can use:  An automated blood pressure machine at a pharmacy.  A home blood pressure monitor.  If you are between 45 years and 39 years old, ask your health care provider if you should take aspirin to prevent strokes.  Have regular diabetes screenings. This involves taking a blood sample to check your fasting blood sugar level.  If you are at a normal weight and have a low risk for diabetes,  have this test once every three years after 35 years of age.  If you are overweight and have a high risk for diabetes, consider being tested at a younger age or more often. PREVENTING INFECTION  Hepatitis B  If you have a higher risk for hepatitis B, you should be screened for this virus. You are considered at high risk for hepatitis B if:  You were born in a country where hepatitis B is common. Ask your health care provider which countries are considered high risk.  Your parents were born in a high-risk country, and you have not been immunized against hepatitis B (hepatitis B vaccine).  You have HIV or AIDS.  You use needles to inject street drugs.  You live with someone who has hepatitis B.  You have had sex with someone who has hepatitis B.  You get hemodialysis treatment.  You take certain medicines for conditions, including cancer, organ transplantation, and autoimmune conditions. Hepatitis C  Blood testing is recommended for:  Everyone born from [birth year] through 1965.  Anyone with known risk factors for hepatitis C. Sexually transmitted infections (STIs)  You should be screened for sexually transmitted infections (STIs) including gonorrhea and chlamydia if:  You are sexually active and are younger than 35 years of age.  You are older than 35 years of age and your health care provider tells you that you are at risk for this type of infection.  Your sexual activity has changed since you were last screened and you are at an increased risk for chlamydia or gonorrhea. Ask your health care provider if you are at risk.  If you do not have HIV, but are at risk, it may be recommended that you take a prescription medicine daily to prevent HIV infection. This is called pre-exposure prophylaxis (PrEP). You are considered at risk if:  You are sexually active and do not regularly use condoms or know the HIV status of your partner(s).  You take drugs by injection.  You are sexually  active with a partner who has HIV. Talk with your health care provider about whether you are at high risk of being infected with HIV. If you choose to begin PrEP, you should first be tested for HIV. You should then be tested every 3 months for as long as you are taking PrEP.  PREGNANCY   If you are premenopausal and you may become pregnant, ask your health care provider about preconception counseling.  If you may  become pregnant, take 400 to 800 micrograms (mcg) of folic acid every day.  If you want to prevent pregnancy, talk to your health care provider about birth control (contraception). OSTEOPOROSIS AND MENOPAUSE   Osteoporosis is a disease in which the bones lose minerals and strength with aging. This can result in serious bone fractures. Your risk for osteoporosis can be identified using a bone density scan.  If you are 61 years of age or older, or if you are at risk for osteoporosis and fractures, ask your health care provider if you should be screened.  Ask your health care provider whether you should take a calcium or vitamin D supplement to lower your risk for osteoporosis.  Menopause may have certain physical symptoms and risks.  Hormone replacement therapy may reduce some of these symptoms and risks. Talk to your health care provider about whether hormone replacement therapy is right for you.  HOME CARE INSTRUCTIONS   Schedule regular health, dental, and eye exams.  Stay current with your immunizations.   Do not use any tobacco products including cigarettes, chewing tobacco, or electronic cigarettes.  If you are pregnant, do not drink alcohol.  If you are breastfeeding, limit how much and how often you drink alcohol.  Limit alcohol intake to no more than 1 drink per day for nonpregnant women. One drink equals 12 ounces of beer, 5 ounces of wine, or 1 ounces of hard liquor.  Do not use street drugs.  Do not share needles.  Ask your health care provider for help if  you need support or information about quitting drugs.  Tell your health care provider if you often feel depressed.  Tell your health care provider if you have ever been abused or do not feel safe at home.   This information is not intended to replace advice given to you by your health care provider. Make sure you discuss any questions you have with your health care provider.   Document Released: 09/24/2010 Document Revised: 04/01/2014 Document Reviewed: 02/10/2013 Elsevier Interactive Patient Education [employer].

## 2015-11-01 ENCOUNTER — Encounter: Payer: Self-pay | Admitting: Obstetrics and Gynecology

## 2016-06-19 ENCOUNTER — Other Ambulatory Visit: Payer: Self-pay | Admitting: Obstetrics and Gynecology

## 2016-11-05 ENCOUNTER — Encounter: Payer: Medicaid Other | Admitting: Obstetrics and Gynecology

## 2017-01-29 ENCOUNTER — Encounter: Payer: Medicaid Other | Admitting: Obstetrics and Gynecology

## 2017-02-22 ENCOUNTER — Other Ambulatory Visit: Payer: Self-pay | Admitting: Obstetrics and Gynecology

## 2017-03-24 IMAGING — US US OB COMP +14 WK
2 series · 14 of 28 positions shown · non-contrast
Comparison: none

[Series 1: us ob comp +14 wk · 0.26mm/px · 13 of 66 slices shown (1 of 2)]
[im 3/66]
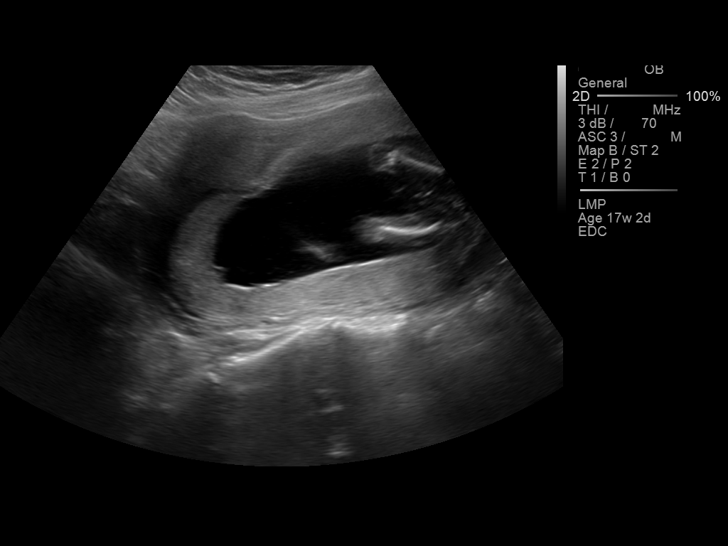
[im 8/66]
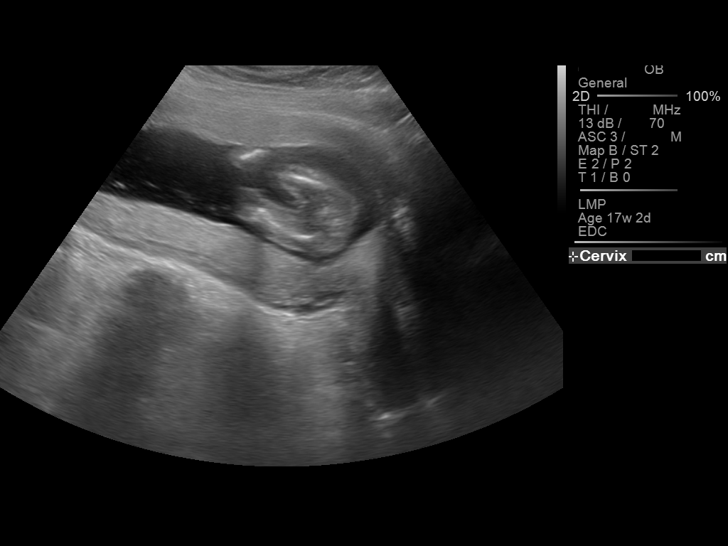
[im 13/66]
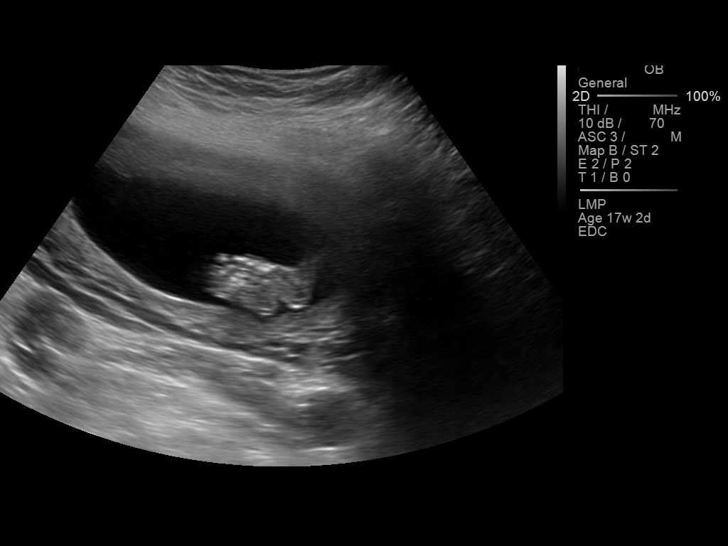
[im 18/66]
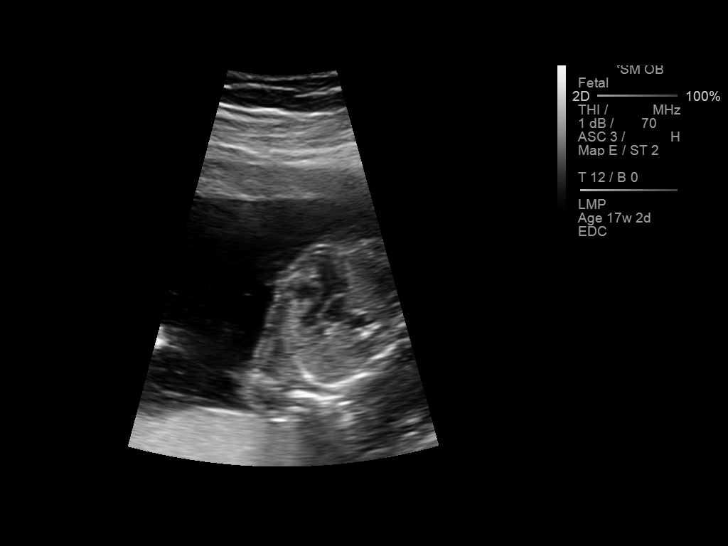
[im 23/66]
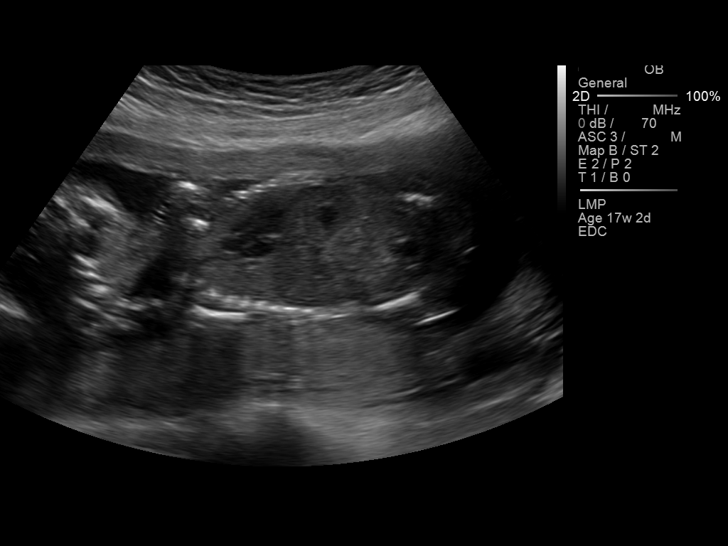
[im 28/66]
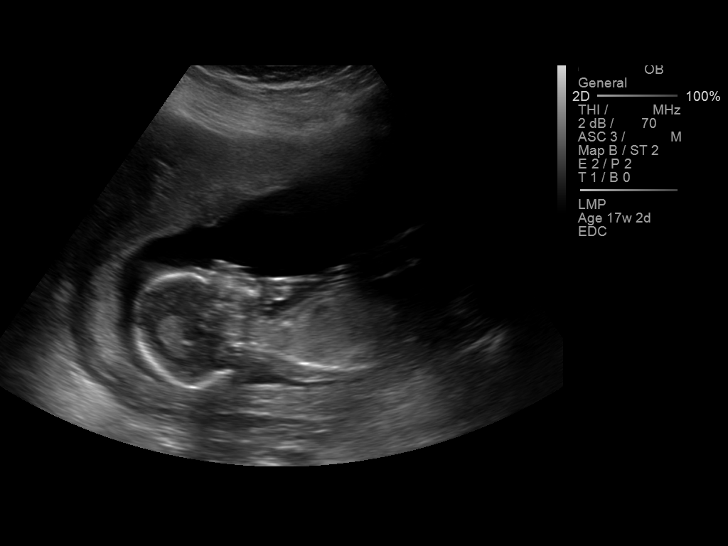
[im 33/66]
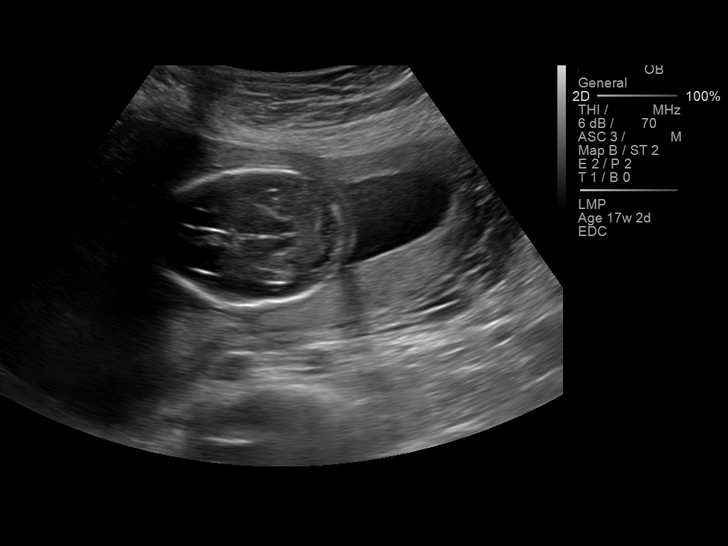
[im 38/66]
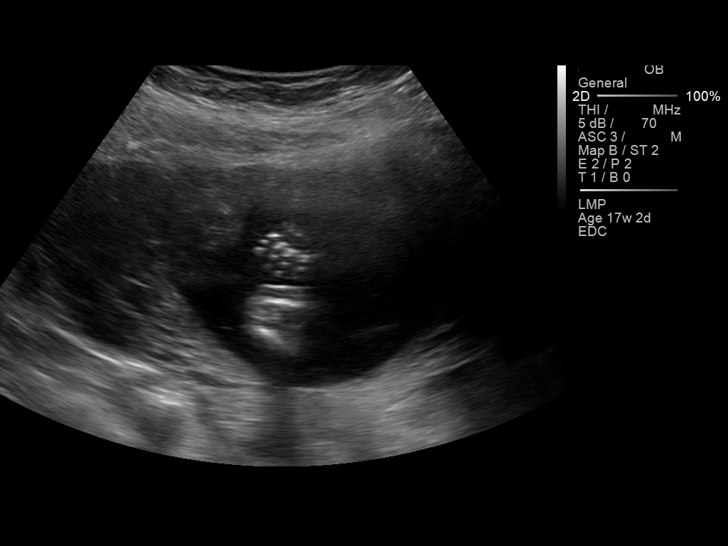
[im 43/66]
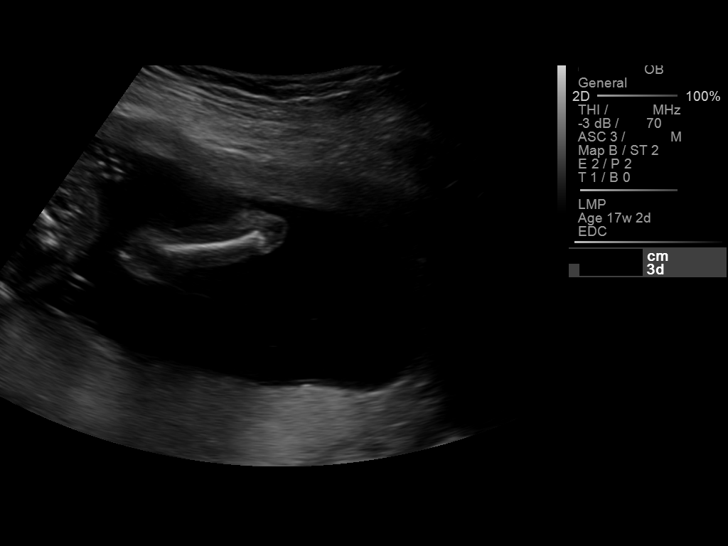
[im 48/66]
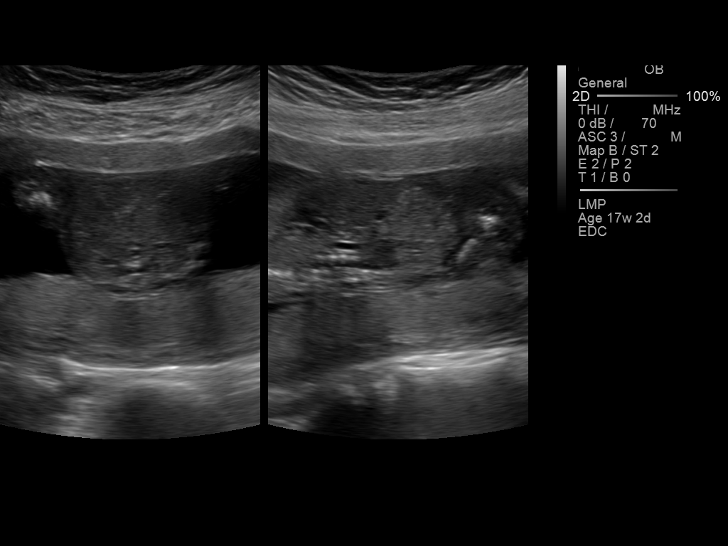
[im 53/66]
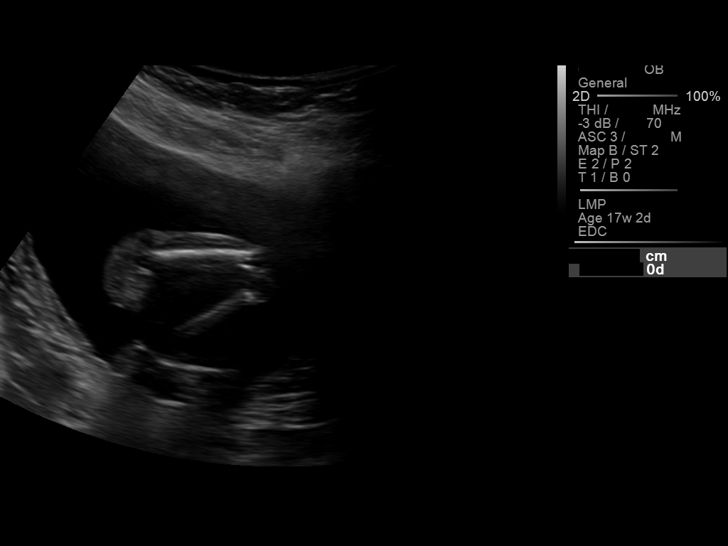
[im 58/66]
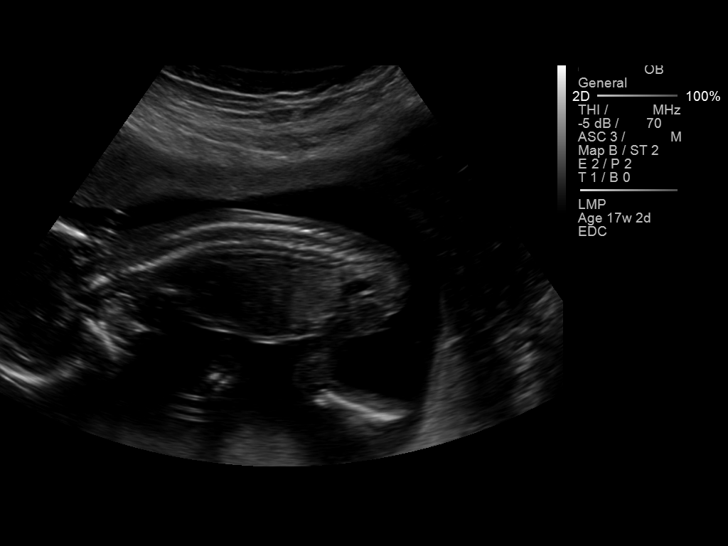
[im 63/66]
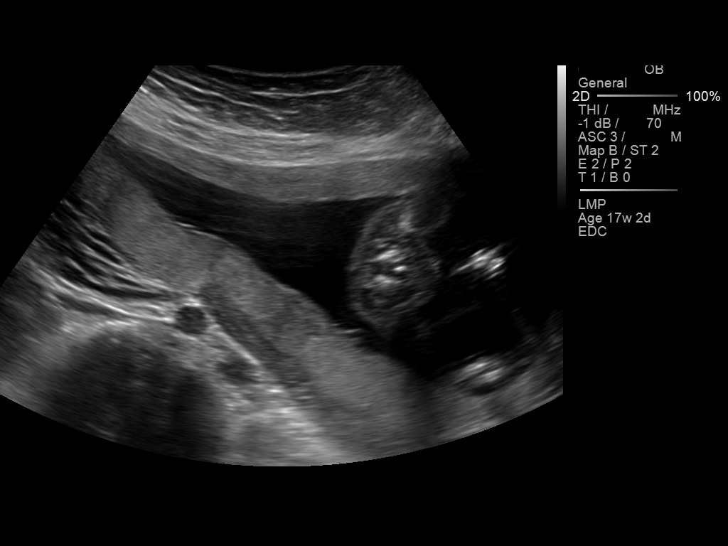

[Series 1001: us ob comp +14 wk · 0.16mm/px · 1 of 2 slices shown (2 of 2)]
[im 1/2]
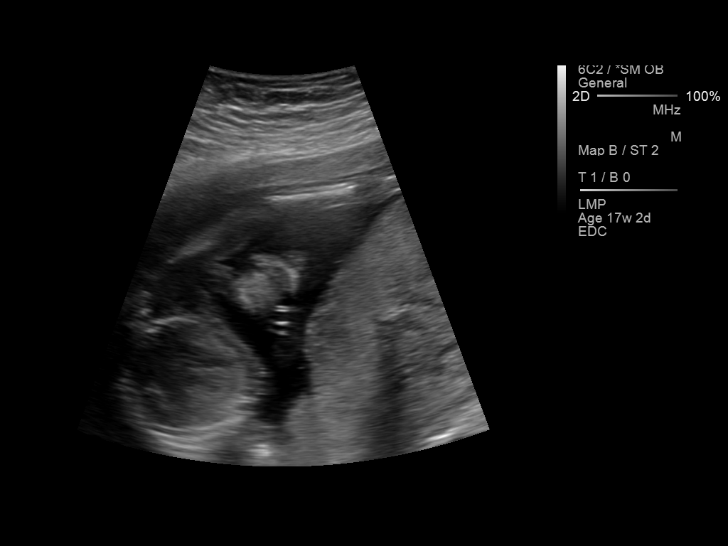

[14 of 28 positions shown; findings below may reference images not displayed]

Canned report from images found in remote index.

Refer to host system for actual result text.

## 2018-04-24 ENCOUNTER — Emergency Department: Payer: Self-pay

## 2018-04-24 ENCOUNTER — Emergency Department
Admission: EM | Admit: 2018-04-24 | Discharge: 2018-04-24 | Disposition: A | Discharge status: Home / Self Care | Payer: Self-pay | Attending: Student in an Organized Health Care Education/Training Program | Admitting: Student in an Organized Health Care Education/Training Program

## 2018-04-24 ENCOUNTER — Other Ambulatory Visit: Payer: Self-pay

## 2018-04-24 ENCOUNTER — Encounter: Payer: Self-pay | Admitting: Intensive Care

## 2018-04-24 DIAGNOSIS — R0602 Shortness of breath: Secondary | ICD-10-CM | POA: Insufficient documentation

## 2018-04-24 DIAGNOSIS — F419 Anxiety disorder, unspecified: Secondary | ICD-10-CM | POA: Insufficient documentation

## 2018-04-24 DIAGNOSIS — R002 Palpitations: Secondary | ICD-10-CM | POA: Insufficient documentation

## 2018-04-24 DIAGNOSIS — Z79899 Other long term (current) drug therapy: Secondary | ICD-10-CM | POA: Insufficient documentation

## 2018-04-24 LAB — CBC
HCT: 41.5 % (ref 36.0–46.0)
Hemoglobin: 14 g/dL (ref 12.0–15.0)
MCH: 32.7 pg (ref 26.0–34.0)
MCHC: 33.7 g/dL (ref 30.0–36.0)
MCV: 97 fL (ref 80.0–100.0)
NRBC: 0 % (ref 0.0–0.2)
PLATELETS: 223 10*3/uL (ref 150–400)
RBC: 4.28 MIL/uL (ref 3.87–5.11)
RDW: 12.2 % (ref 11.5–15.5)
WBC: 7.3 10*3/uL (ref 4.0–10.5)

## 2018-04-24 LAB — BASIC METABOLIC PANEL
ANION GAP: 9 (ref 5–15)
BUN: 9 mg/dL (ref 6–20)
CALCIUM: 9.2 mg/dL (ref 8.9–10.3)
CHLORIDE: 106 mmol/L (ref 98–111)
CO2: 23 mmol/L (ref 22–32)
CREATININE: 0.5 mg/dL (ref 0.44–1.00)
GFR calc Af Amer: >60 mL/min (ref >60)
Glucose, Bld: 123 mg/dL — ABNORMAL HIGH (ref 70–99)
POTASSIUM: 4 mmol/L (ref 3.5–5.1)
Sodium: 138 mmol/L (ref 135–145)

## 2018-04-24 LAB — FIBRIN DERIVATIVES D-DIMER (ARMC ONLY): Fibrin derivatives D-dimer (ARMC): 277.57 ng/mL (FEU) (ref 0.00–499.00)

## 2018-04-24 LAB — TROPONIN I: Troponin I: <0.03 ng/mL (ref <0.03)

## 2018-04-24 LAB — TSH: TSH: 1.456 u[IU]/mL (ref 0.350–4.500)

## 2018-04-24 MED ORDER — LORAZEPAM 1 MG PO TABS
1.0000 mg | ORAL_TABLET | Freq: Once | ORAL | Status: AC
  Administered 2018-04-24: 1 mg via ORAL
  Filled 2018-04-24: qty 1

## 2018-04-24 MED ORDER — SODIUM CHLORIDE 0.9 % IV BOLUS: 500.0000 mL | Freq: Once | INTRAVENOUS | Status: DC

## 2018-04-24 MED ORDER — LORAZEPAM 0.5 MG PO TABS: 0.5000 mg | ORAL_TABLET | Freq: Three times a day (TID) | ORAL | 0 refills | Status: AC | PRN

## 2018-04-24 MED ORDER — SODIUM CHLORIDE 0.9% FLUSH: 3.0000 mL | Freq: Once | INTRAVENOUS | Status: DC

## 2018-04-24 NOTE — ED Provider Notes (Signed)
[facility] Emergency Department Provider Note    First MD Initiated Contact with Patient 04/24/18 1405     (approximate)  I have reviewed the triage vital signs and the nursing notes.   HISTORY  Chief Complaint Tachycardia    HPI Taylor [name] is a 38 y.o. female presents the ER for evaluation of tachycardia palpitations shortness of breath and generalized feeling of anxiety for the past several weeks.  States that she started feeling anxious several months ago.  She is been stressed out at home as well as with work life.  Denies any recent medication changes.  No recent fevers.  She is on birth control.  Denies any nausea or vomiting or diarrhea.  Presented to ER because she is worried that something else could be causing her symptoms.  Denies any history of thyroid illness.  No history of blood clots.  No history of asthma or bronchitis.  She does not smoke.    Past Medical History:  Diagnosis Date  . History of cesarean section   . Vaginal Pap smear, abnormal 2013   colpo neg last3 normal   Family History  Problem Relation Age of Onset  . Hyperlipidemia Mother   . Hearing loss Daughter   . Kidney disease Daughter   . Diabetes Neg Hx   . Heart disease Neg Hx   . Breast cancer Neg Hx   . Ovarian cancer Neg Hx    Past Surgical History:  Procedure Laterality Date  . CESAREAN SECTION  2005  . CESAREAN SECTION N/A 02/02/2015   Procedure: CESAREAN SECTION;  Surgeon: Hildred Laser, MD;  Location: [facility] ORS;  Service: Obstetrics;  Laterality: N/A;  . HALLUX VALGUS CORRECTION Bilateral 2000   both feet done in 2000 at different times   Patient Active Problem List   Diagnosis Date Noted  . Anemia of pregnancy in third trimester 11/23/2014      Prior to Admission medications   Medication Sig Start Date End Date Taking? Authorizing Provider  ALPRAZolam (XANAX) 0.25 MG tablet Take 0.25 mg by mouth daily. 04/08/18  Yes [provider]    JUNEL FE 1.5/30 1.5-30 MG-MCG tablet Take 1 tablet by mouth See admin instructions. 03/30/18  Yes [provider]  LORazepam (ATIVAN) 0.5 MG tablet Take 1 tablet (0.5 mg total) by mouth every 8 (eight) hours as needed for anxiety. 04/24/18 04/24/19  Willy Eddy, MD    Allergies Patient has no known allergies.    Social History Social History   Tobacco Use  . Smoking status: Never Smoker  . Smokeless tobacco: Never Used  Substance Use Topics  . Alcohol use: Yes  . Drug use: No    Review of Systems Patient denies headaches, rhinorrhea, blurry vision, numbness, shortness of breath, chest pain, edema, cough, abdominal pain, nausea, vomiting, diarrhea, dysuria, fevers, rashes or hallucinations unless otherwise stated above in HPI. ____________________________________________   PHYSICAL EXAM:  VITAL SIGNS: Vitals:   04/24/18 1500 04/24/18 1533  BP: 124/82   Pulse: (!) 104   Resp: (!) 22   Temp:  98.3 F (36.8 C)  SpO2: 100%     Constitutional: Alert and oriented.  Eyes: Conjunctivae are normal.  Head: Atraumatic. Nose: No congestion/rhinnorhea. Mouth/Throat: Mucous membranes are moist.   Neck: No stridor. Painless ROM.  Cardiovascular: tachycardic rate, regular rhythm. Grossly normal heart sounds.  Good peripheral circulation. Respiratory: Normal respiratory effort.  No retractions. Lungs CTAB. Gastrointestinal: Soft and nontender. No distention. No abdominal bruits.  No CVA tenderness. Genitourinary:  Musculoskeletal: No lower extremity tenderness nor edema.  No joint effusions. Neurologic:  Normal speech and language. No gross focal neurologic deficits are appreciated. No facial droop Skin:  Skin is warm, dry and intact. No rash noted. Psychiatric: Mood and affect are anxiousl. Speech and behavior are normal.  ____________________________________________   LABS (all labs ordered are listed, but only abnormal results are displayed)  Results for orders  placed or performed during the hospital encounter of 04/24/18 (from the past 24 hour(s))  Basic metabolic panel     Status: Abnormal   Collection Time: 04/24/18  1:30 PM  Result Value Ref Range   Sodium 138 135 - 145 mmol/L   Potassium 4.0 3.5 - 5.1 mmol/L   Chloride 106 98 - 111 mmol/L   CO2 23 22 - 32 mmol/L   Glucose, Bld 123 (H) 70 - 99 mg/dL   BUN 9 6 - 20 mg/dL   Creatinine, Ser 7.82 0.44 - 1.00 mg/dL   Calcium 9.2 8.9 - 95.6 mg/dL   GFR calc non Af Amer >60 >60 mL/min   GFR calc Af Amer >60 >60 mL/min   Anion gap 9 5 - 15  CBC     Status: None   Collection Time: 04/24/18  1:30 PM  Result Value Ref Range   WBC 7.3 4.0 - 10.5 K/uL   RBC 4.28 3.87 - 5.11 MIL/uL   Hemoglobin 14.0 12.0 - 15.0 g/dL   HCT 21.3 08.6 - 57.8 %   MCV 97.0 80.0 - 100.0 fL   MCH 32.7 26.0 - 34.0 pg   MCHC 33.7 30.0 - 36.0 g/dL   RDW 46.9 62.9 - 52.8 %   Platelets 223 150 - 400 K/uL   nRBC 0.0 0.0 - 0.2 %  Troponin I - ONCE - STAT     Status: None   Collection Time: 04/24/18  1:30 PM  Result Value Ref Range   Troponin I <0.03 <0.03 ng/mL  TSH     Status: None   Collection Time: 04/24/18  1:30 PM  Result Value Ref Range   TSH 1.456 0.350 - 4.500 uIU/mL  Fibrin derivatives D-Dimer ([facility] only)     Status: None   Collection Time: 04/24/18  2:36 PM  Result Value Ref Range   Fibrin derivatives D-dimer ([facility]) 277.57 0.00 - 499.00 ng/mL (FEU)   ____________________________________________  EKG My review and personal interpretation at Time: 13:22   Indication: tachycardia  Rate: 120  Rhythm: sinus Axis: normal Other: normal intervals, no stemi ____________________________________________  RADIOLOGY  I personally reviewed all radiographic images ordered to evaluate for the above acute complaints and reviewed radiology reports and findings.  These findings were personally discussed with the patient.  Please see medical record for radiology  report.  ____________________________________________   PROCEDURES  Procedure(s) performed:  Procedures    Critical Care performed: no ____________________________________________   INITIAL IMPRESSION / ASSESSMENT AND PLAN / ED COURSE  Pertinent labs & imaging results that were available during my care of the patient were reviewed by me and considered in my medical decision making (see chart for details).   DDX: Dehydration, dysrhythmia, electrolyte abnormality, anemia, stress  XIAO KAUSHIK is a 38 y.o. who presents to the ED with symptoms as described above.  Patient with mild tachycardia.  Blood will be sent for the above differential.  She is otherwise well-appearing in no acute distress.  No evidence of preexcitation syndrome.  Doubt ACS.  Not consistent with  dissection.  Will send d-dimer to further re-stratify for PE.  Clinical Course as of Apr 25 1535  Fri Apr 24, 2018  1525 Patient reassessed and well-appearing.  Tachycardia resolved.  D-dimer is negative.  TSH is normal.  Blood work otherwise reassuring.  Patient stable and appropriate for outpatient follow-up.   [PR]    Clinical Course User Index [PR] Willy Eddyobinson, Shakala Marlatt, MD     As part of my medical decision making, I reviewed the following data within the electronic MEDICAL RECORD NUMBER Nursing notes reviewed and incorporated, Labs reviewed, notes from prior ED visits and [place] Controlled Substance Database   ____________________________________________   FINAL CLINICAL IMPRESSION(S) / ED DIAGNOSES  Final diagnoses:  Palpitations      NEW MEDICATIONS STARTED DURING THIS VISIT:  New Prescriptions   LORAZEPAM (ATIVAN) 0.5 MG TABLET    Take 1 tablet (0.5 mg total) by mouth every 8 (eight) hours as needed for anxiety.     Note:  This document was prepared using Dragon voice recognition software and may include unintentional dictation errors.    Willy Eddyobinson, Taavi Hoose, MD 04/24/18 1537

## 2018-04-24 NOTE — ED Triage Notes (Signed)
Patient reports feeling her heart racing yesterday and then felt it start back up today while sitting at work. C/o chest tightness

## 2018-12-24 ENCOUNTER — Other Ambulatory Visit: Payer: Self-pay

## 2018-12-24 DIAGNOSIS — Z20822 Contact with and (suspected) exposure to covid-19: Secondary | ICD-10-CM

## 2018-12-25 LAB — NOVEL CORONAVIRUS, NAA: SARS-CoV-2, NAA: NOT DETECTED

## 2020-11-14 IMAGING — CR DG CHEST 2V
1 series · 2 of 2 positions shown · non-contrast
Comparison: None.

CLINICAL DATA: Progressive chest pain.

EXAM:
CHEST - 2 VIEW

[Series 1: dg chest 2 view · 0.14mm/px · 2 of 2 slices shown]
[im 1/2]
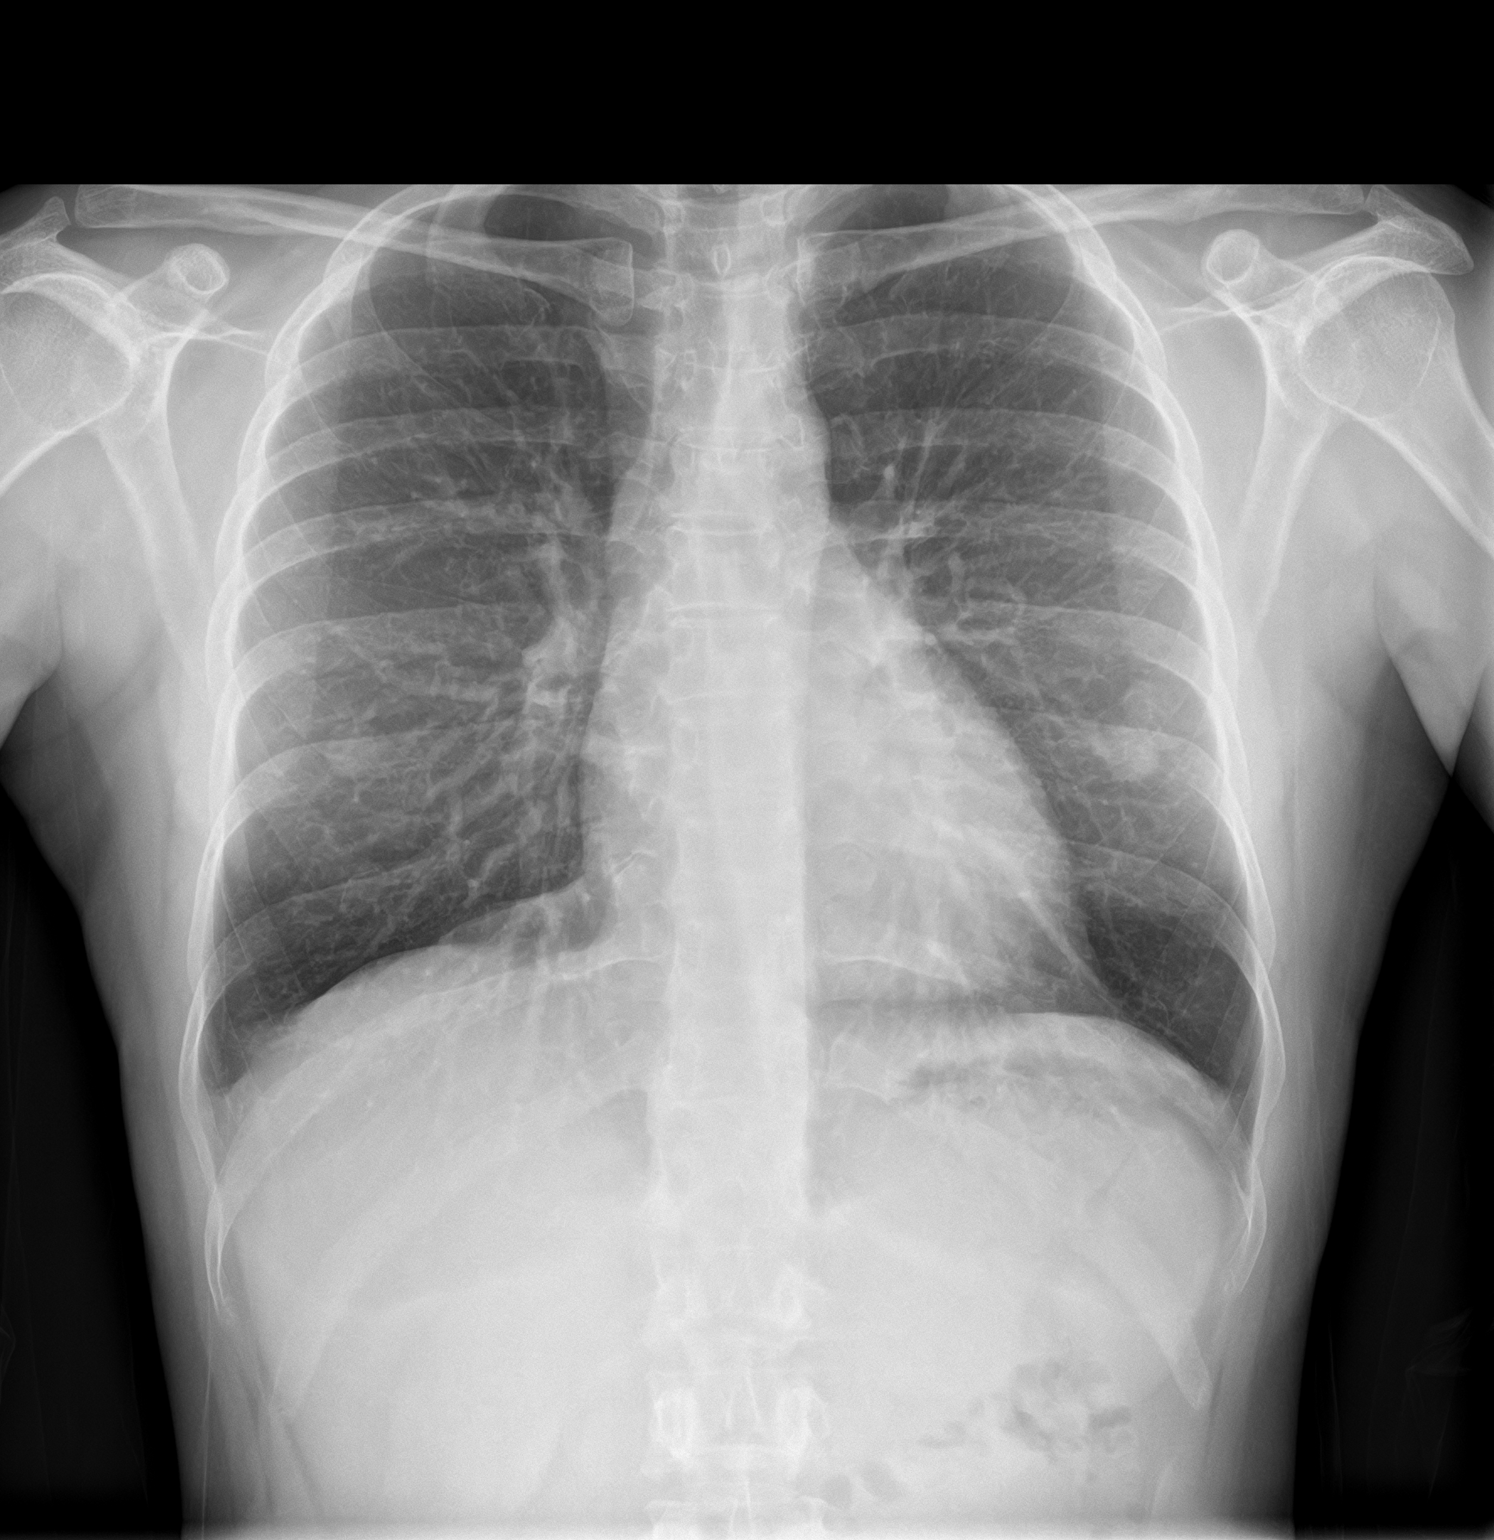
[im 2/2]
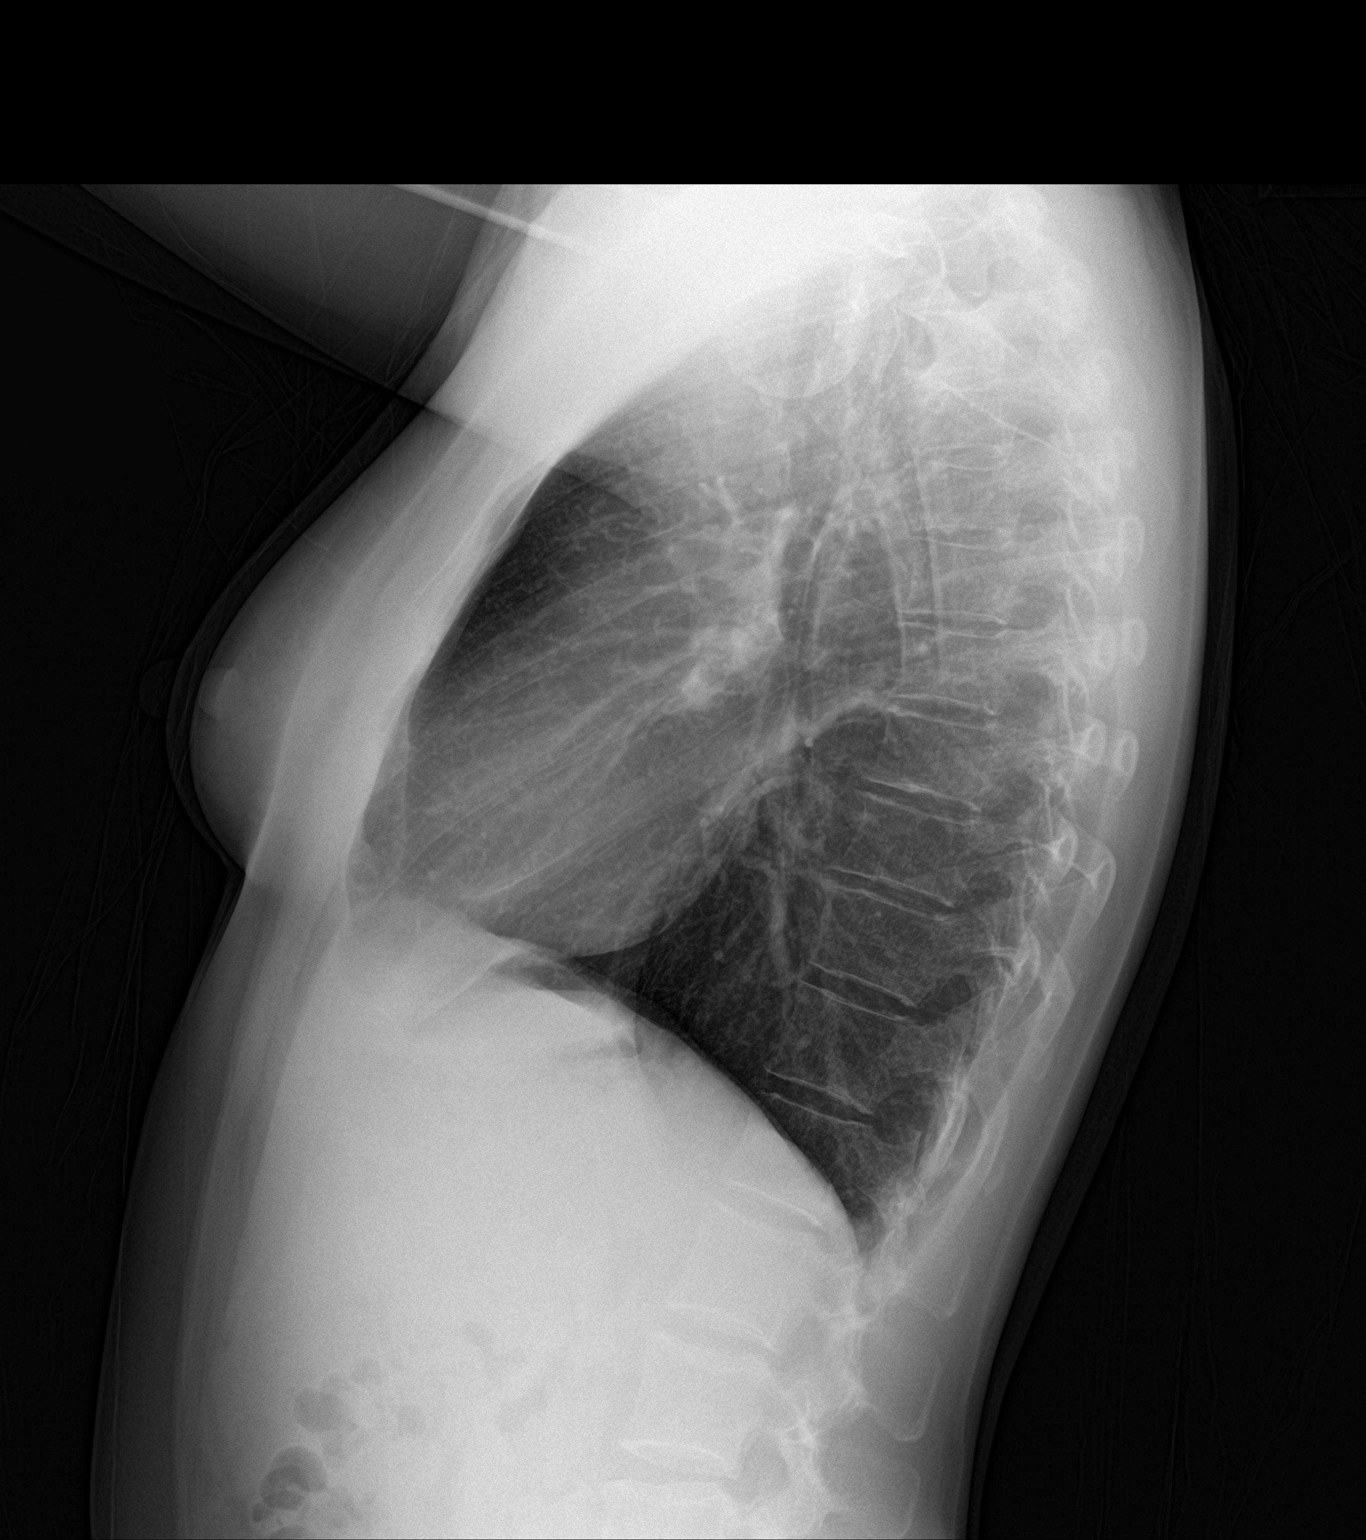

[2 of 2 positions shown; findings below may reference images not displayed]

FINDINGS: The heart size and mediastinal contours are within normal limits.
Both lungs are clear. The visualized skeletal structures are
unremarkable.
IMPRESSION: Normal exam.

## 2022-04-30 ENCOUNTER — Emergency Department
Admission: EM | Admit: 2022-04-30 | Discharge: 2022-04-30 | Disposition: A | Discharge status: Home / Self Care | Payer: 59 | Attending: Emergency Medicine | Admitting: Emergency Medicine

## 2022-04-30 ENCOUNTER — Other Ambulatory Visit: Payer: Self-pay

## 2022-04-30 ENCOUNTER — Emergency Department: Payer: 59

## 2022-04-30 DIAGNOSIS — R0789 Other chest pain: Secondary | ICD-10-CM | POA: Diagnosis present

## 2022-04-30 DIAGNOSIS — R197 Diarrhea, unspecified: Secondary | ICD-10-CM | POA: Insufficient documentation

## 2022-04-30 LAB — BASIC METABOLIC PANEL
Anion gap: 9 (ref 5–15)
BUN: 8 mg/dL (ref 6–20)
CO2: 24 mmol/L (ref 22–32)
Calcium: 9.3 mg/dL (ref 8.9–10.3)
Chloride: 105 mmol/L (ref 98–111)
Creatinine, Ser: 0.61 mg/dL (ref 0.44–1.00)
GFR, Estimated: >60 mL/min (ref >60)
Glucose, Bld: 91 mg/dL (ref 70–99)
Potassium: 4 mmol/L (ref 3.5–5.1)
Sodium: 138 mmol/L (ref 135–145)

## 2022-04-30 LAB — CBC
HCT: 43.9 % (ref 36.0–46.0)
Hemoglobin: 14.7 g/dL (ref 12.0–15.0)
MCH: 32 pg (ref 26.0–34.0)
MCHC: 33.5 g/dL (ref 30.0–36.0)
MCV: 95.4 fL (ref 80.0–100.0)
Platelets: 228 10*3/uL (ref 150–400)
RBC: 4.6 MIL/uL (ref 3.87–5.11)
RDW: 12 % (ref 11.5–15.5)
WBC: 5.6 10*3/uL (ref 4.0–10.5)
nRBC: 0 % (ref 0.0–0.2)

## 2022-04-30 LAB — TROPONIN I (HIGH SENSITIVITY)
Troponin I (High Sensitivity): <2 ng/L (ref <18)
Troponin I (High Sensitivity): 2 ng/L (ref <18)

## 2022-04-30 LAB — D-DIMER, QUANTITATIVE: D-Dimer, Quant: <0.27 ug/mL-FEU (ref 0.00–0.50)

## 2022-04-30 NOTE — ED Provider Notes (Signed)
[facility] Provider Note    Event Date/Time   First MD Initiated Contact with Patient 04/30/22 1225     (approximate)   History   Chief Complaint Chest Pain   HPI  Taylor [name] is a 42 y.o. female with no significant past medical history who presents to the ED complaining of chest pain.  Patient reports that she was woken from sleep around 4:00 this morning with heavy discomfort in the center of her chest along with a "unsettling feeling."  She states that the pain eased up and she was able to go back to sleep, but returned when she woke up at 6 AM.  Pain has been present constantly since then, does not seem to be exacerbated or alleviated by anything in particular.  She denies any associated difficulty breathing and has not had any fevers or cough.  She has not noticed any pain or swelling in her legs, does report taking OCPs.     Physical Exam   Triage Vital Signs: ED Triage Vitals [04/30/22 1044]  Enc Vitals Group     BP (!) 138/97     Pulse Rate (!) 129     Resp 20     Temp 98.4 F (36.9 C)     Temp Source Oral     SpO2 98 %     Weight 108 lb 14.5 oz (49.4 kg)     Height 5' (1.524 m)     Head Circumference      Peak Flow      Pain Score 6     Pain Loc      Pain Edu?      Excl. in [place]?     Most recent vital signs: Vitals:   04/30/22 1044  BP: (!) 138/97  Pulse: (!) 129  Resp: 20  Temp: 98.4 F (36.9 C)  SpO2: 98%    Constitutional: Alert and oriented. Eyes: Conjunctivae are normal. Head: Atraumatic. Nose: No congestion/rhinnorhea. Mouth/Throat: Mucous membranes are moist.  Cardiovascular: Tachycardic, regular rhythm. Grossly normal heart sounds.  2+ radial pulses bilaterally. Respiratory: Normal respiratory effort.  No retractions. Lungs CTAB.  No chest wall tenderness to palpation. Gastrointestinal: Soft and nontender. No distention. Musculoskeletal: No lower extremity tenderness nor edema.  Neurologic:  Normal speech and  language. No gross focal neurologic deficits are appreciated.    ED Results / Procedures / Treatments   Labs (all labs ordered are listed, but only abnormal results are displayed) Labs Reviewed  BASIC METABOLIC PANEL  CBC  D-DIMER, QUANTITATIVE  POC URINE PREG, ED  TROPONIN I (HIGH SENSITIVITY)  TROPONIN I (HIGH SENSITIVITY)     EKG  ED ECG REPORT I, Blake Divine, the attending physician, personally viewed and interpreted this ECG.   Date: 04/30/2022  EKG Time: 10:42  Rate: 101  Rhythm: sinus tachycardia  Axis: Normal  Intervals:none  ST&T Change: None  RADIOLOGY Chest x-ray reviewed and interpreted by me with no infiltrate, edema, or effusion.  PROCEDURES:  Critical Care performed: No  Procedures   MEDICATIONS ORDERED IN ED: Medications - No data to display   IMPRESSION / MDM / [place] / ED COURSE  I reviewed the triage vital signs and the nursing notes.                              42 y.o. female with no significant past medical history who presents to the ED  complaining of constant pressure in her chest since waking up this morning.  Patient's presentation is most consistent with acute presentation with potential threat to life or bodily function.  Differential diagnosis includes, but is not limited to, arrhythmia, ACS, PE, pneumonia, pneumothorax, dissection, GERD, musculoskeletal pain, and anxiety.  Patient nontoxic-appearing and in no acute distress, vital signs remarkable for tachycardia but otherwise reassuring.  EKG shows sinus tachycardia with no evidence of ischemia, low suspicion for ACS given atypical symptoms.  She does take OCPs and with her tachycardia, we will check D-dimer to rule out PE.  If this is negative, then I doubt PE as she is low risk by Wells criteria.  Additional labs are reassuring with no significant anemia, leukocytosis, lecture abnormality, or AKI.  Chest x-ray is negative for acute process.  Patient declines pain  medication and we will reassess following additional labs.  D-dimer within normal limits and repeat troponin is negative.  Patient's chest pain has almost entirely resolved and she is appropriate for discharge home with PCP follow-up.  She was counseled to return to the ED for new or worsening symptoms, patient agrees with plan.      FINAL CLINICAL IMPRESSION(S) / ED DIAGNOSES   Final diagnoses:  Atypical chest pain     Rx / DC Orders   ED Discharge Orders     None        Note:  This document was prepared using Dragon voice recognition software and may include unintentional dictation errors.   Blake Divine, MD 04/30/22 1409

## 2022-04-30 NOTE — ED Triage Notes (Signed)
Pt here with cp that started yesterday. Pt states pain is centered and she felt a tingle in her left hand but mostly just the pressure in the center of her chest. Pt denies N/V but had some diarrhea.

## 2022-07-10 ENCOUNTER — Telehealth: Payer: 59 | Admitting: Physician Assistant

## 2022-07-10 DIAGNOSIS — B029 Zoster without complications: Secondary | ICD-10-CM

## 2022-07-10 DIAGNOSIS — L089 Local infection of the skin and subcutaneous tissue, unspecified: Secondary | ICD-10-CM

## 2022-07-10 MED ORDER — AMOXICILLIN-POT CLAVULANATE 875-125 MG PO TABS: 1.0000 | ORAL_TABLET | Freq: Two times a day (BID) | ORAL | 0 refills | Status: AC

## 2022-07-10 MED ORDER — VALACYCLOVIR HCL 1 G PO TABS: 1000.0000 mg | ORAL_TABLET | Freq: Three times a day (TID) | ORAL | 0 refills | Status: AC

## 2022-07-10 NOTE — Patient Instructions (Signed)
Taylor [name], thank you for joining Margaretann Loveless, PA-C for today's virtual visit.  While this provider is not your primary care provider (PCP), if your PCP is located in our provider database this encounter information will be shared with them immediately following your visit.   A [facility] MyChart account gives you access to today's visit and all your visits, tests, and labs performed at [facility] " click here if you don't have a [facility] MyChart account or go to [email or web address]  Consent: (Patient) Taylor [name] provided verbal consent for this virtual visit at the beginning of the encounter.  Current Medications:  Current Outpatient Medications:    amoxicillin-clavulanate (AUGMENTIN) 875-125 MG tablet, Take 1 tablet by mouth 2 (two) times daily., Disp: 14 tablet, Rfl: 0   valACYclovir (VALTREX) 1000 MG tablet, Take 1 tablet (1,000 mg total) by mouth 3 (three) times daily for 7 days., Disp: 21 tablet, Rfl: 0   ALPRAZolam (XANAX) 0.25 MG tablet, Take 0.25 mg by mouth daily., Disp: , Rfl:    JUNEL FE 1.5/30 1.5-30 MG-MCG tablet, Take 1 tablet by mouth See admin instructions., Disp: , Rfl:    Medications ordered in this encounter:  Meds ordered this encounter  Medications   valACYclovir (VALTREX) 1000 MG tablet    Sig: Take 1 tablet (1,000 mg total) by mouth 3 (three) times daily for 7 days.    Dispense:  21 tablet    Refill:  0    Order Specific Question:   Supervising Provider    Answer:   Merrilee Jansky [id number]   amoxicillin-clavulanate (AUGMENTIN) 875-125 MG tablet    Sig: Take 1 tablet by mouth 2 (two) times daily.    Dispense:  14 tablet    Refill:  0    Order Specific Question:   Supervising Provider    Answer:   Merrilee Jansky [id number]     *If you need refills on other medications prior to your next appointment, please contact your pharmacy*  Follow-Up: Call back or seek an in-person evaluation if the symptoms worsen or if  the condition fails to improve as anticipated.  [facility] [phone]  Other Instructions  Shingles  Shingles, which is also known as herpes zoster, is an infection that causes a painful skin rash and fluid-filled blisters. It is caused by a virus. Shingles only develops in people who: Have had chickenpox. Have been vaccinated against chickenpox. Shingles is rare in this group. What are the causes? Shingles is caused by varicella-zoster virus. This is the same virus that causes chickenpox. After a person is exposed to the virus, it stays in the body in an inactive (dormant) state. Shingles develops if the virus is reactivated. This can happen many years after the first (initial) exposure to the virus. It is not known what causes this virus to be reactivated. What increases the risk? People who have had chickenpox or received the chickenpox vaccine are at risk for shingles. Shingles infection is more common in people who: Are older than 42 years of age. Have a weakened disease-fighting system (immune system), such as people with: HIV (human immunodeficiency virus). AIDS (acquired immunodeficiency syndrome). Cancer. Are taking medicines that weaken the immune system, such as organ transplant medicines. Are experiencing a lot of stress. What are the signs or symptoms? Early symptoms of this condition include itching, tingling, and pain in an area on your skin. Pain may be described as burning, stabbing, or throbbing. A few  days or weeks after early symptoms start, a painful red rash appears. The rash is usually on one side of the body and has a band-like or belt-like pattern. The rash eventually turns into fluid-filled blisters that break open, change into scabs, and dry up in about 2-3 weeks. At any time during the infection, you may also develop: A fever. Chills. A headache. Nausea. How is this diagnosed? This condition is diagnosed with a skin exam. Skin or fluid  samples (a culture) may be taken from the blisters before a diagnosis is made. How is this treated? The rash may last for several weeks. There is not a specific cure for this condition. Your health care provider may prescribe medicines to help you manage pain, recover more quickly, and avoid long-term problems. Medicines may include: Antiviral medicines. Anti-inflammatory medicines. Pain medicines. Anti-itching medicines (antihistamines). If the area involved is on your face, you may be referred to a specialist, such as an eye doctor (ophthalmologist) or an ear, nose, and throat (ENT) doctor (otorhinolaryngologist) to help you avoid eye problems, chronic pain, or disability. Follow these instructions at home: Medicines Take over-the-counter and prescription medicines only as told by your health care provider. Apply an anti-itch cream or numbing cream to the affected area as told by your health care provider. Relieving itching and discomfort  Apply cold, wet cloths (cold compresses) to the area of the rash or blisters as told by your health care provider. Cool baths can be soothing. Try adding baking soda or dry oatmeal to the water to reduce itching. Do not bathe in hot water. Use calamine lotion as recommended by your health care provider. This is an over-the-counter lotion that helps to relieve itchiness. Blister and rash care Keep your rash covered with a loose bandage (dressing). Wear loose-fitting clothing to help ease the pain of material rubbing against the rash. Wash your hands with soap and water for at least 20 seconds before and after you change your dressing. If soap and water are not available, use hand sanitizer. Change your dressing as told by your health care provider. Keep your rash and blisters clean by washing the area with mild soap and cool water as told by your health care provider. Check your rash every day for signs of infection. Check for: More redness, swelling, or  pain. Fluid or blood. Warmth. Pus or a bad smell. Do not scratch your rash or pick at your blisters. To help avoid scratching: Keep your fingernails clean and cut short. Wear gloves or mittens while you sleep, if scratching is a problem. General instructions Rest as told by your health care provider. Wash your hands often with soap and water for at least 20 seconds. If soap and water are not available, use hand sanitizer. Doing this lowers your chance of getting a bacterial skin infection. Before your blisters change into scabs, your shingles infection can cause chickenpox in people who have never had it or have never been vaccinated against it. To prevent this from happening, avoid contact with other people, especially: Babies. Pregnant women. Children who have eczema. Older people who have transplants. People who have chronic illnesses, such as cancer or AIDS. Keep all follow-up visits. This is important. How is this prevented? Getting vaccinated is the best way to prevent shingles and protect against shingles complications. If you have not been vaccinated, talk with your health care provider about getting the vaccine. Where to find more information Centers for Disease Control and Prevention: [employer]Ex[employer].com.br Contact a health  care provider if: Your pain is not relieved with prescribed medicines. Your pain does not get better after the rash heals. You have any of these signs of infection: More redness, swelling, or pain around the rash. Fluid or blood coming from the rash. Warmth coming from your rash. Pus or a bad smell coming from the rash. A fever. Get help right away if: The rash is on your face or nose. You have facial pain, pain around your eye area, or loss of feeling on one side of your face. You have difficulty seeing. You have ear pain or have ringing in your ear. You have a loss of taste. Your condition gets worse. Summary Shingles, also known as herpes zoster, is an  infection that causes a painful skin rash and fluid-filled blisters. This condition is diagnosed with a skin exam. Skin or fluid samples (a culture) may be taken from the blisters. Keep your rash covered with a loose bandage (dressing). Wear loose-fitting clothing to help ease the pain of material rubbing against the rash. Before your blisters change into scabs, your shingles infection can cause chickenpox in people who have never had it or have never been vaccinated against it. This information is not intended to replace advice given to you by your health care provider. Make sure you discuss any questions you have with your health care provider. Document Revised: 03/06/2020 Document Reviewed: 03/06/2020 Elsevier Patient Education  2023 Elsevier Inc.   Cellulitis, Adult  Cellulitis is a skin infection. The infected area is often warm, red, swollen, and sore. It occurs most often on the legs, feet, and toes, but can happen on any part of the body. This condition can be life-threatening without treatment. It is very important to get treated right away. What are the causes? This condition is caused by bacteria. The bacteria enter through a break in the skin, such as: A cut. A burn. A bug bite. An animal bite. An open sore. A crack. What increases the risk? Having a weak body's defense system (immune system). Being older than 42 years old. Having a blood sugar problem (diabetes). Having a long-term liver disease (cirrhosis) or kidney disease. Being very overweight (obese). Having a skin problem, such as: An itchy rash. A rash caused by a fungus. A rash with blisters. Slow movement of blood in the veins (venous stasis). Fluid buildup below the skin (edema). This condition is more likely to occur in people who: Have open cuts, burns, bites, or scrapes on the skin. Have been treated with high-energy rays (radiation). Use IV drugs. What are the signs or symptoms? Skin that: Looks red  or purple, or slightly darker than your usual skin color. Has streaks. Has spots. Is swollen. Is sore or painful when you touch it. Is warm. A fever. Chills. Blisters. Tiredness (fatigue). How is this treated? Medicines to treat infections or allergies. Rest. Placing cold or warm cloths on the skin. Staying in the hospital, if the condition is very bad. You may need medicines through an IV. Follow these instructions at home: Medicines Take over-the-counter and prescription medicines only as told by your doctor. If you were prescribed antibiotics, take them as told by your doctor. Do not stop using them even if you start to feel better. General instructions Drink enough fluid to keep your pee (urine) pale yellow. Do not touch or rub the infected area. Raise (elevate) the infected area above the level of your heart while you are sitting or lying down. Return to your  normal activities when your doctor says that it is safe. Place cold or warm cloths on the area as told by your doctor. Keep all follow-up visits. Your doctor will need to make sure that a more serious infection is not developing. Contact a doctor if: You have a fever. You do not start to get better after 1-2 days of treatment. Your bone or joint under the infected area starts to hurt after the skin has healed. Your infection comes back in the same area or another area. Signs of this may include: You have a swollen bump in the area. Your red area gets larger, turns dark in color, or hurts more. You have more fluid coming from the wound. Pus or a bad smell develops in your infected area. You have more pain. You feel sick and have muscle aches and weakness. You develop vomiting or watery poop that will not go away. Get help right away if: You see red streaks coming from the area. You notice the skin turns purple or black and falls off. These symptoms may be an emergency. Get help right away. Call 911. Do not wait to  see if the symptoms will go away. Do not drive yourself to the hospital. This information is not intended to replace advice given to you by your health care provider. Make sure you discuss any questions you have with your health care provider. Document Revised: 11/06/2021 Document Reviewed: 11/06/2021 Elsevier Patient Education  2023 Elsevier Inc.    If you have been instructed to have an in-person evaluation today at a local Urgent Care facility, please use the link below. It will take you to a list of all of our available [facility], including address, phone number and hours of operation. Please do not delay care.  [facility]  If you or a family member do not have a primary care provider, use the link below to schedule a visit and establish care. When you choose a [facility] primary care physician or advanced practice provider, you gain a long-term partner in health. Find a Primary Care Provider  Learn more about [facility]'s in-office and virtual care options: [facility] Get Care Now

## 2022-07-10 NOTE — Progress Notes (Signed)
Virtual Visit Consent   Taylor [name], you are scheduled for a virtual visit with a [facility] provider today. Just as with appointments in the office, your consent must be obtained to participate. Your consent will be active for this visit and any virtual visit you may have with one of our providers in the next 365 days. If you have a MyChart account, a copy of this consent can be sent to you electronically.  As this is a virtual visit, video technology does not allow for your provider to perform a traditional examination. This may limit your provider's ability to fully assess your condition. If your provider identifies any concerns that need to be evaluated in person or the need to arrange testing (such as labs, EKG, etc.), we will make arrangements to do so. Although advances in technology are sophisticated, we cannot ensure that it will always work on either your end or our end. If the connection with a video visit is poor, the visit may have to be switched to a telephone visit. With either a video or telephone visit, we are not always able to ensure that we have a secure connection.  By engaging in this virtual visit, you consent to the provision of healthcare and authorize for your insurance to be billed (if applicable) for the services provided during this visit. Depending on your insurance coverage, you may receive a charge related to this service.  I need to obtain your verbal consent now. Are you willing to proceed with your visit today? AVIAH SORCI has provided verbal consent on 07/10/2022 for a virtual visit (video or telephone). Margaretann Loveless, PA-C  Date: 07/10/2022 12:42 PM  Virtual Visit via Video Note   I, Margaretann Loveless, connected with  KALEESI [name]  ([id number], [birth year]) on 07/10/22 at 12:45 PM EDT by a video-enabled telemedicine application and verified that I am speaking with the correct person using two identifiers.  Location: Patient: Virtual Visit  Location Patient: Home Provider: Virtual Visit Location Provider: Home Office   I discussed the limitations of evaluation and management by telemedicine and the availability of in person appointments. The patient expressed understanding and agreed to proceed.    History of Present Illness: [facility] [name] is a 42 y.o. who identifies as a female who was assigned female at birth, and is being seen today for facial swelling. Started Sunday, 07/07/22. Noticed a small cluster of "pimples" on the right lateral forehead near the hairline. Started putting "pimple cream" on the area. Then on Monday noticed that the area was more painful and pain was radiating down to the right lateral eyebrow and the right lateral side of the eye. Then pain progressed to include right temporal area and right under eye area. This morning awoke to find that whole area more tender, but also swollen. Has used Ibuprofen with some mild relief. She has had chicken pox as a child. Denies fevers, chills, nausea, vomiting, eye pain, visual changes.    Problems:  Patient Active Problem List   Diagnosis Date Noted   Anemia of pregnancy in third trimester 11/23/2014    Allergies: No Known Allergies Medications:  Current Outpatient Medications:    amoxicillin-clavulanate (AUGMENTIN) 875-125 MG tablet, Take 1 tablet by mouth 2 (two) times daily., Disp: 14 tablet, Rfl: 0   valACYclovir (VALTREX) 1000 MG tablet, Take 1 tablet (1,000 mg total) by mouth 3 (three) times daily for 7 days., Disp: 21 tablet, Rfl: 0   ALPRAZolam (XANAX) 0.25  MG tablet, Take 0.25 mg by mouth daily., Disp: , Rfl:    JUNEL FE 1.5/30 1.5-30 MG-MCG tablet, Take 1 tablet by mouth See admin instructions., Disp: , Rfl:   Observations/Objective: Patient is well-developed, well-nourished in no acute distress.  Resting comfortably at home.  Head is normocephalic, atraumatic.  No labored breathing.  Speech is clear and coherent with logical content.  Patient is  alert and oriented at baseline.  Red cluster of vesicular looking lesions on a erythematous base on the right lateral forehead at the hairline. Mild swelling noted on the lateral aspect of the right eye, over the right temporal area, and into the under eye/over zygomatic arch area of the right cheek  Assessment and Plan: 1. Herpes zoster without complication - valACYclovir (VALTREX) 1000 MG tablet; Take 1 tablet (1,000 mg total) by mouth 3 (three) times daily for 7 days.  Dispense: 21 tablet; Refill: 0  2. Skin infection - amoxicillin-clavulanate (AUGMENTIN) 875-125 MG tablet; Take 1 tablet by mouth 2 (two) times daily.  Dispense: 14 tablet; Refill: 0  - Question possible shingles with secondary soft tissue skin infection - Valtrex given - Augmentin for skin infection - Avoid picking, scratching, touching open areas - Cool compresses - Can continue Ibuprofen - Seek in person evaluation if worsening  Follow Up Instructions: I discussed the assessment and treatment plan with the patient. The patient was provided an opportunity to ask questions and all were answered. The patient agreed with the plan and demonstrated an understanding of the instructions.  A copy of instructions were sent to the patient via MyChart unless otherwise noted below.    The patient was advised to call back or seek an in-person evaluation if the symptoms worsen or if the condition fails to improve as anticipated.  Time:  I spent 12 minutes with the patient via telehealth technology discussing the above problems/concerns.    Margaretann Loveless, PA-C
